# Patient Record
Sex: Female | Born: 1960 | Race: White | Hispanic: No | Marital: Married | Smoking: Current every day smoker
Health system: Southern US, Community
[De-identification: ages and names within clinical notes are randomized; demographics above are authoritative.]

## PROBLEM LIST (undated history)

## (undated) DIAGNOSIS — I509 Heart failure, unspecified: Secondary | ICD-10-CM

## (undated) DIAGNOSIS — J189 Pneumonia, unspecified organism: Secondary | ICD-10-CM

## (undated) DIAGNOSIS — J449 Chronic obstructive pulmonary disease, unspecified: Secondary | ICD-10-CM

## (undated) DIAGNOSIS — I209 Angina pectoris, unspecified: Secondary | ICD-10-CM

## (undated) DIAGNOSIS — K219 Gastro-esophageal reflux disease without esophagitis: Secondary | ICD-10-CM

## (undated) DIAGNOSIS — G4730 Sleep apnea, unspecified: Secondary | ICD-10-CM

## (undated) DIAGNOSIS — I219 Acute myocardial infarction, unspecified: Secondary | ICD-10-CM

## (undated) HISTORY — PX: TONSILLECTOMY: SUR1361

## (undated) HISTORY — PX: CORONARY ARTERY BYPASS GRAFT: SHX141

## (undated) HISTORY — PX: CORONARY ANGIOPLASTY: SHX604

---

## 2015-07-04 ENCOUNTER — Inpatient Hospital Stay (HOSPITAL_COMMUNITY): Payer: Medicaid Other

## 2015-07-04 ENCOUNTER — Inpatient Hospital Stay (HOSPITAL_COMMUNITY)
Admission: AD | Admit: 2015-07-04 | Discharge: 2015-07-20 | DRG: 296 | Disposition: E | Payer: Medicaid Other | Source: Other Acute Inpatient Hospital | Attending: Internal Medicine | Admitting: Internal Medicine

## 2015-07-04 ENCOUNTER — Encounter (HOSPITAL_COMMUNITY): Payer: Self-pay | Admitting: Surgery

## 2015-07-04 DIAGNOSIS — G253 Myoclonus: Secondary | ICD-10-CM | POA: Diagnosis present

## 2015-07-04 DIAGNOSIS — J930 Spontaneous tension pneumothorax: Secondary | ICD-10-CM | POA: Diagnosis present

## 2015-07-04 DIAGNOSIS — R739 Hyperglycemia, unspecified: Secondary | ICD-10-CM | POA: Diagnosis present

## 2015-07-04 DIAGNOSIS — E870 Hyperosmolality and hypernatremia: Secondary | ICD-10-CM | POA: Diagnosis present

## 2015-07-04 DIAGNOSIS — I2510 Atherosclerotic heart disease of native coronary artery without angina pectoris: Secondary | ICD-10-CM | POA: Diagnosis not present

## 2015-07-04 DIAGNOSIS — G9341 Metabolic encephalopathy: Secondary | ICD-10-CM | POA: Diagnosis present

## 2015-07-04 DIAGNOSIS — Z515 Encounter for palliative care: Secondary | ICD-10-CM | POA: Diagnosis not present

## 2015-07-04 DIAGNOSIS — R9401 Abnormal electroencephalogram [EEG]: Secondary | ICD-10-CM | POA: Insufficient documentation

## 2015-07-04 DIAGNOSIS — G935 Compression of brain: Secondary | ICD-10-CM | POA: Diagnosis present

## 2015-07-04 DIAGNOSIS — B963 Hemophilus influenzae [H. influenzae] as the cause of diseases classified elsewhere: Secondary | ICD-10-CM | POA: Diagnosis present

## 2015-07-04 DIAGNOSIS — R4020 Unspecified coma: Secondary | ICD-10-CM | POA: Diagnosis present

## 2015-07-04 DIAGNOSIS — E222 Syndrome of inappropriate secretion of antidiuretic hormone: Secondary | ICD-10-CM | POA: Diagnosis present

## 2015-07-04 DIAGNOSIS — R403 Persistent vegetative state: Secondary | ICD-10-CM | POA: Diagnosis present

## 2015-07-04 DIAGNOSIS — R140 Abdominal distension (gaseous): Secondary | ICD-10-CM

## 2015-07-04 DIAGNOSIS — G931 Anoxic brain damage, not elsewhere classified: Secondary | ICD-10-CM | POA: Diagnosis present

## 2015-07-04 DIAGNOSIS — G936 Cerebral edema: Secondary | ICD-10-CM | POA: Diagnosis present

## 2015-07-04 DIAGNOSIS — Z978 Presence of other specified devices: Secondary | ICD-10-CM

## 2015-07-04 DIAGNOSIS — J982 Interstitial emphysema: Secondary | ICD-10-CM | POA: Diagnosis present

## 2015-07-04 DIAGNOSIS — Z6834 Body mass index (BMI) 34.0-34.9, adult: Secondary | ICD-10-CM

## 2015-07-04 DIAGNOSIS — G40901 Epilepsy, unspecified, not intractable, with status epilepticus: Secondary | ICD-10-CM | POA: Diagnosis present

## 2015-07-04 DIAGNOSIS — I6782 Cerebral ischemia: Secondary | ICD-10-CM | POA: Diagnosis not present

## 2015-07-04 DIAGNOSIS — J9602 Acute respiratory failure with hypercapnia: Secondary | ICD-10-CM | POA: Diagnosis present

## 2015-07-04 DIAGNOSIS — G4730 Sleep apnea, unspecified: Secondary | ICD-10-CM | POA: Diagnosis present

## 2015-07-04 DIAGNOSIS — Z66 Do not resuscitate: Secondary | ICD-10-CM | POA: Diagnosis not present

## 2015-07-04 DIAGNOSIS — I252 Old myocardial infarction: Secondary | ICD-10-CM

## 2015-07-04 DIAGNOSIS — J441 Chronic obstructive pulmonary disease with (acute) exacerbation: Secondary | ICD-10-CM | POA: Diagnosis present

## 2015-07-04 DIAGNOSIS — E876 Hypokalemia: Secondary | ICD-10-CM | POA: Diagnosis not present

## 2015-07-04 DIAGNOSIS — Z951 Presence of aortocoronary bypass graft: Secondary | ICD-10-CM

## 2015-07-04 DIAGNOSIS — I469 Cardiac arrest, cause unspecified: Secondary | ICD-10-CM | POA: Diagnosis present

## 2015-07-04 DIAGNOSIS — G9340 Encephalopathy, unspecified: Secondary | ICD-10-CM | POA: Diagnosis not present

## 2015-07-04 DIAGNOSIS — R7881 Bacteremia: Secondary | ICD-10-CM | POA: Diagnosis present

## 2015-07-04 DIAGNOSIS — F17210 Nicotine dependence, cigarettes, uncomplicated: Secondary | ICD-10-CM | POA: Diagnosis present

## 2015-07-04 DIAGNOSIS — Z452 Encounter for adjustment and management of vascular access device: Secondary | ICD-10-CM

## 2015-07-04 DIAGNOSIS — J9600 Acute respiratory failure, unspecified whether with hypoxia or hypercapnia: Secondary | ICD-10-CM

## 2015-07-04 HISTORY — DX: Sleep apnea, unspecified: G47.30

## 2015-07-04 HISTORY — DX: Heart failure, unspecified: I50.9

## 2015-07-04 HISTORY — DX: Chronic obstructive pulmonary disease, unspecified: J44.9

## 2015-07-04 HISTORY — DX: Gastro-esophageal reflux disease without esophagitis: K21.9

## 2015-07-04 HISTORY — DX: Angina pectoris, unspecified: I20.9

## 2015-07-04 HISTORY — DX: Acute myocardial infarction, unspecified: I21.9

## 2015-07-04 HISTORY — DX: Pneumonia, unspecified organism: J18.9

## 2015-07-04 LAB — CBC WITH DIFFERENTIAL/PLATELET
BASOS ABS: 0 10*3/uL (ref 0.0–0.1)
BASOS PCT: 0 %
EOS ABS: 0 10*3/uL (ref 0.0–0.7)
Eosinophils Relative: 0 %
HEMATOCRIT: 39.4 % (ref 36.0–46.0)
HEMOGLOBIN: 12.5 g/dL (ref 12.0–15.0)
Lymphocytes Relative: 3 %
Lymphs Abs: 0.5 10*3/uL — ABNORMAL LOW (ref 0.7–4.0)
MCH: 29.6 pg (ref 26.0–34.0)
MCHC: 31.7 g/dL (ref 30.0–36.0)
MCV: 93.4 fL (ref 78.0–100.0)
Monocytes Absolute: 1.1 10*3/uL — ABNORMAL HIGH (ref 0.1–1.0)
Monocytes Relative: 6 %
NEUTROS ABS: 15.6 10*3/uL — AB (ref 1.7–7.7)
NEUTROS PCT: 91 %
Platelets: 207 10*3/uL (ref 150–400)
RBC: 4.22 MIL/uL (ref 3.87–5.11)
RDW: 14 % (ref 11.5–15.5)
WBC: 17.2 10*3/uL — AB (ref 4.0–10.5)

## 2015-07-04 LAB — PROTIME-INR
INR: 1.15 (ref 0.00–1.49)
Prothrombin Time: 14.9 seconds (ref 11.6–15.2)

## 2015-07-04 LAB — BLOOD GAS, ARTERIAL
Acid-Base Excess: 0.5 mmol/L (ref 0.0–2.0)
BICARBONATE: 26.7 meq/L — AB (ref 20.0–24.0)
DRAWN BY: 437071
FIO2: 1
MECHVT: 450 mL
O2 SAT: 99.6 %
PATIENT TEMPERATURE: 98.6
PEEP: 5 cmH2O
PO2 ART: 379 mmHg — AB (ref 80.0–100.0)
RATE: 20 resp/min
TCO2: 28.6 mmol/L (ref 0–100)
pCO2 arterial: 60.8 mmHg (ref 35.0–45.0)
pH, Arterial: 7.265 — ABNORMAL LOW (ref 7.350–7.450)

## 2015-07-04 LAB — LACTIC ACID, PLASMA: LACTIC ACID, VENOUS: 1.5 mmol/L (ref 0.5–2.0)

## 2015-07-04 LAB — TROPONIN I
TROPONIN I: 0.07 ng/mL — AB (ref <0.031)
TROPONIN I: 0.04 ng/mL — AB (ref <0.031)
Troponin I: 0.04 ng/mL — ABNORMAL HIGH (ref <0.031)

## 2015-07-04 LAB — COMPREHENSIVE METABOLIC PANEL
ALBUMIN: 2.6 g/dL — AB (ref 3.5–5.0)
ALK PHOS: 79 U/L (ref 38–126)
ALT: 53 U/L (ref 14–54)
ANION GAP: 14 (ref 5–15)
AST: 76 U/L — AB (ref 15–41)
BUN: 12 mg/dL (ref 6–20)
CALCIUM: 7.8 mg/dL — AB (ref 8.9–10.3)
CO2: 23 mmol/L (ref 22–32)
Chloride: 95 mmol/L — ABNORMAL LOW (ref 101–111)
Creatinine, Ser: 1.09 mg/dL — ABNORMAL HIGH (ref 0.44–1.00)
GFR calc Af Amer: >60 mL/min (ref >60)
GFR calc non Af Amer: 57 mL/min — ABNORMAL LOW (ref >60)
GLUCOSE: 149 mg/dL — AB (ref 65–99)
Potassium: 4 mmol/L (ref 3.5–5.1)
SODIUM: 132 mmol/L — AB (ref 135–145)
Total Bilirubin: 0.5 mg/dL (ref 0.3–1.2)
Total Protein: 5.4 g/dL — ABNORMAL LOW (ref 6.5–8.1)

## 2015-07-04 LAB — POCT I-STAT 3, ART BLOOD GAS (G3+)
BICARBONATE: 26 meq/L — AB (ref 20.0–24.0)
O2 Saturation: 89 %
Patient temperature: 36.9
TCO2: 27 mmol/L (ref 0–100)
pCO2 arterial: 46.6 mmHg — ABNORMAL HIGH (ref 35.0–45.0)
pH, Arterial: 7.355 (ref 7.350–7.450)
pO2, Arterial: 60 mmHg — ABNORMAL LOW (ref 80.0–100.0)

## 2015-07-04 LAB — PHOSPHORUS: PHOSPHORUS: 4.7 mg/dL — AB (ref 2.5–4.6)

## 2015-07-04 LAB — ECHOCARDIOGRAM COMPLETE: WEIGHTICAEL: 3382.74 [oz_av]

## 2015-07-04 LAB — GLUCOSE, CAPILLARY
GLUCOSE-CAPILLARY: 108 mg/dL — AB (ref 65–99)
GLUCOSE-CAPILLARY: 116 mg/dL — AB (ref 65–99)
GLUCOSE-CAPILLARY: 207 mg/dL — AB (ref 65–99)
Glucose-Capillary: 136 mg/dL — ABNORMAL HIGH (ref 65–99)
Glucose-Capillary: 163 mg/dL — ABNORMAL HIGH (ref 65–99)
Glucose-Capillary: 140 mg/dL — ABNORMAL HIGH (ref 65–99)

## 2015-07-04 LAB — RAPID URINE DRUG SCREEN, HOSP PERFORMED
AMPHETAMINES: NOT DETECTED
BENZODIAZEPINES: POSITIVE — AB
Barbiturates: NOT DETECTED
Cocaine: NOT DETECTED
Opiates: POSITIVE — AB
Tetrahydrocannabinol: NOT DETECTED

## 2015-07-04 LAB — ETHANOL

## 2015-07-04 LAB — TRIGLYCERIDES: TRIGLYCERIDES: 93 mg/dL (ref <150)

## 2015-07-04 LAB — MRSA PCR SCREENING: MRSA by PCR: POSITIVE — AB

## 2015-07-04 LAB — PROCALCITONIN: Procalcitonin: 2.72 ng/mL

## 2015-07-04 LAB — MAGNESIUM: Magnesium: 1.7 mg/dL (ref 1.7–2.4)

## 2015-07-04 MED ORDER — MUPIROCIN 2 % EX OINT
1.0000 "application " | TOPICAL_OINTMENT | Freq: Two times a day (BID) | CUTANEOUS | Status: DC
  Administered 2015-07-04 – 2015-07-07 (×7): 1 via NASAL
  Filled 2015-07-04: qty 22

## 2015-07-04 MED ORDER — FENTANYL CITRATE (PF) 100 MCG/2ML IJ SOLN
100.0000 ug | INTRAMUSCULAR | Status: DC | PRN
  Administered 2015-07-04 – 2015-07-05 (×2): 100 ug via INTRAVENOUS
  Filled 2015-07-04 (×2): qty 2

## 2015-07-04 MED ORDER — HEPARIN SODIUM (PORCINE) 5000 UNIT/ML IJ SOLN
5000.0000 [IU] | Freq: Three times a day (TID) | INTRAMUSCULAR | Status: DC
  Administered 2015-07-04 – 2015-07-07 (×11): 5000 [IU] via SUBCUTANEOUS
  Filled 2015-07-04 (×11): qty 1

## 2015-07-04 MED ORDER — FENTANYL CITRATE (PF) 100 MCG/2ML IJ SOLN: 100.0000 ug | INTRAMUSCULAR | Status: DC | PRN

## 2015-07-04 MED ORDER — DEXTROSE 5 % IV SOLN
1.0000 g | INTRAVENOUS | Status: DC
  Administered 2015-07-04 – 2015-07-05 (×2): 1 g via INTRAVENOUS
  Filled 2015-07-04 (×3): qty 10

## 2015-07-04 MED ORDER — CHLORHEXIDINE GLUCONATE 0.12% ORAL RINSE (MEDLINE KIT)
15.0000 mL | Freq: Two times a day (BID) | OROMUCOSAL | Status: DC
Start: 2015-07-04 — End: 2015-07-07
  Administered 2015-07-04 – 2015-07-07 (×7): 15 mL via OROMUCOSAL

## 2015-07-04 MED ORDER — PANTOPRAZOLE SODIUM 40 MG IV SOLR
40.0000 mg | Freq: Every day | INTRAVENOUS | Status: DC
  Administered 2015-07-04 – 2015-07-06 (×3): 40 mg via INTRAVENOUS
  Filled 2015-07-04 (×3): qty 40

## 2015-07-04 MED ORDER — SODIUM CHLORIDE 0.9 % IV SOLN
1500.0000 mg | Freq: Once | INTRAVENOUS | Status: DC
  Filled 2015-07-04: qty 30

## 2015-07-04 MED ORDER — PROPOFOL 1000 MG/100ML IV EMUL
0.0000 ug/kg/min | INTRAVENOUS | Status: DC
  Administered 2015-07-04: 25 ug/kg/min via INTRAVENOUS
  Administered 2015-07-04: 35 ug/kg/min via INTRAVENOUS
  Administered 2015-07-04: 50 ug/kg/min via INTRAVENOUS
  Administered 2015-07-04: 35 ug/kg/min via INTRAVENOUS
  Administered 2015-07-04 – 2015-07-07 (×18): 50 ug/kg/min via INTRAVENOUS
  Filled 2015-07-04 (×7): qty 100
  Filled 2015-07-04: qty 200
  Filled 2015-07-04 (×7): qty 100
  Filled 2015-07-04: qty 200
  Filled 2015-07-04 (×5): qty 100
  Filled 2015-07-04: qty 200
  Filled 2015-07-04: qty 100

## 2015-07-04 MED ORDER — PHENYTOIN SODIUM 50 MG/ML IJ SOLN
100.0000 mg | Freq: Three times a day (TID) | INTRAMUSCULAR | Status: DC
  Administered 2015-07-04 – 2015-07-07 (×10): 100 mg via INTRAVENOUS
  Filled 2015-07-04 (×11): qty 2

## 2015-07-04 MED ORDER — CHLORHEXIDINE GLUCONATE CLOTH 2 % EX PADS
6.0000 | MEDICATED_PAD | Freq: Every day | CUTANEOUS | Status: DC
  Administered 2015-07-05 – 2015-07-07 (×3): 6 via TOPICAL

## 2015-07-04 MED ORDER — SODIUM CHLORIDE 0.9 % IV SOLN
250.0000 mL | INTRAVENOUS | Status: DC | PRN
  Administered 2015-07-06: 250 mL via INTRAVENOUS

## 2015-07-04 MED ORDER — INSULIN ASPART 100 UNIT/ML ~~LOC~~ SOLN
2.0000 [IU] | SUBCUTANEOUS | Status: DC
Start: 2015-07-04 — End: 2015-07-07
  Administered 2015-07-04 (×2): 2 [IU] via SUBCUTANEOUS
  Administered 2015-07-04: 4 [IU] via SUBCUTANEOUS
  Administered 2015-07-04: 6 [IU] via SUBCUTANEOUS
  Administered 2015-07-05: 2 [IU] via SUBCUTANEOUS
  Administered 2015-07-05: 4 [IU] via SUBCUTANEOUS
  Administered 2015-07-05 (×2): 2 [IU] via SUBCUTANEOUS
  Administered 2015-07-06: 4 [IU] via SUBCUTANEOUS
  Administered 2015-07-06: 2 [IU] via SUBCUTANEOUS
  Administered 2015-07-07: 4 [IU] via SUBCUTANEOUS
  Administered 2015-07-07: 2 [IU] via SUBCUTANEOUS

## 2015-07-04 MED ORDER — METHYLPREDNISOLONE SODIUM SUCC 40 MG IJ SOLR
40.0000 mg | Freq: Two times a day (BID) | INTRAMUSCULAR | Status: DC
  Administered 2015-07-04 – 2015-07-07 (×7): 40 mg via INTRAVENOUS
  Filled 2015-07-04 (×7): qty 1

## 2015-07-04 MED ORDER — ALBUTEROL SULFATE (2.5 MG/3ML) 0.083% IN NEBU: 2.5000 mg | INHALATION_SOLUTION | RESPIRATORY_TRACT | Status: DC | PRN

## 2015-07-04 MED ORDER — GADOBENATE DIMEGLUMINE 529 MG/ML IV SOLN
20.0000 mL | Freq: Once | INTRAVENOUS | Status: AC | PRN
  Administered 2015-07-04: 20 mL via INTRAVENOUS

## 2015-07-04 MED ORDER — SODIUM CHLORIDE 0.9 % IV SOLN
INTRAVENOUS | Status: DC
  Administered 2015-07-04 – 2015-07-05 (×3): via INTRAVENOUS

## 2015-07-04 MED ORDER — DEXTROSE 5 % IV SOLN
500.0000 mg | INTRAVENOUS | Status: DC
  Administered 2015-07-04 – 2015-07-06 (×3): 500 mg via INTRAVENOUS
  Filled 2015-07-04 (×4): qty 500

## 2015-07-04 MED ORDER — SODIUM CHLORIDE 0.9 % IV BOLUS (SEPSIS)
1000.0000 mL | Freq: Once | INTRAVENOUS | Status: AC
  Administered 2015-07-04: 1000 mL via INTRAVENOUS

## 2015-07-04 MED ORDER — SODIUM CHLORIDE 0.9 % IV SOLN: 1500.0000 mg | Freq: Once | INTRAVENOUS | Status: DC

## 2015-07-04 MED ORDER — ANTISEPTIC ORAL RINSE SOLUTION (CORINZ)
7.0000 mL | OROMUCOSAL | Status: DC
  Administered 2015-07-04 – 2015-07-07 (×36): 7 mL via OROMUCOSAL

## 2015-07-04 MED ORDER — SODIUM CHLORIDE 0.9 % IV SOLN
1500.0000 mg | Freq: Once | INTRAVENOUS | Status: AC
  Administered 2015-07-04: 1500 mg via INTRAVENOUS
  Filled 2015-07-04: qty 30

## 2015-07-04 MED ORDER — LEVETIRACETAM 500 MG/5ML IV SOLN
2000.0000 mg | Freq: Two times a day (BID) | INTRAVENOUS | Status: DC
  Administered 2015-07-04 – 2015-07-07 (×7): 2000 mg via INTRAVENOUS
  Filled 2015-07-04 (×8): qty 20

## 2015-07-04 MED ORDER — IPRATROPIUM-ALBUTEROL 0.5-2.5 (3) MG/3ML IN SOLN
3.0000 mL | Freq: Four times a day (QID) | RESPIRATORY_TRACT | Status: DC
  Administered 2015-07-04 – 2015-07-07 (×15): 3 mL via RESPIRATORY_TRACT
  Filled 2015-07-04 (×15): qty 3

## 2015-07-04 MED FILL — Fentanyl Citrate Preservative Free (PF) Inj 100 MCG/2ML: INTRAMUSCULAR | Qty: 4 | Status: AC

## 2015-07-04 NOTE — Progress Notes (Signed)
  Echocardiogram 2D Echocardiogram has been performed.  Arvil ChacoFoster, Sherrel Ploch 07/12/2015, 2:29 PM

## 2015-07-04 NOTE — Progress Notes (Signed)
EEG Completed; Results Pending  

## 2015-07-04 NOTE — Progress Notes (Signed)
Pt transported to MRI and back to 2H12 on vent without incident. Vitals remained stable throughout. Pt was suctioned at MRI due to increased secretions.

## 2015-07-04 NOTE — Procedures (Signed)
Central Venous Catheter Insertion Procedure Note Gabriella Baker Dec 27, 1960  Procedure: Insertion of Central Venous Catheter Indications: Assessment of intravascular volume, Drug and/or fluid administration and Frequent blood sampling  Procedure Details Consent: Risks of procedure as well as the alternatives and risks of each were explained to the (patient/caregiver).  Consent for procedure obtained.   Time Out: Verified patient identification, verified procedure, site/side was marked, verified correct patient position, special equipment/implants available, medications/allergies/relevent history reviewed, required imaging and test results available.  Performed  Maximum sterile technique was used including antiseptics, cap, gloves, gown, hand hygiene, mask and sheet. Skin prep: Chlorhexidine; local anesthetic administered A antimicrobial bonded/coated triple lumen catheter was placed in the left subclavian vein to 18 cm using the Seldinger technique.  Evaluation Blood flow good Complications: No apparent complications Patient did tolerate procedure well. Chest X-ray ordered to verify placement.  CXR: pending.    Procedure performed with ultrasound guidance for real time vessel cannulation.      Gabriella BrimBrandi Seymone Forlenza, NP-C [facility] Pgr: [phone] or if no answer [phone] 06/20/2015, 4:40 PM

## 2015-07-04 NOTE — Procedures (Signed)
HPI:  55 y/o with CP arrest and myoclonus  TECHNICAL SUMMARY:  A multichannel referential and bipolar montage EEG using the standard international 10-20 system was performed on the patient described as sedated and on ventilator.  There is no occipital dominant rhythm.  The background consists primarily of a burst/suppression type of rhythm with evidence of generalized sharp waves, some of which are triphasic in nature.  ACTIVATION:  Stepwise photic stimulation and hyperventilation are not performed.  No noxious stimuli was administered to determine reactivity of the background.  ABNORMAL ACTIVITY:  As above, there are frequent periodic generalized sharp waves discharges, some of which have a triphasic appearance.  SLEEP:  No sleep  IMPRESSION: This EEG is markedly abnormal due to burst-suppression pattern with intermittent bilateral periodic sharp waves, some of which have a triphasic appearance.   Clinical Correlation: This record shows evidence of severe diffuse or bilateral cerebral dysfunction which more likely reflects the patient's history of anoxic brain injury as opposed to ictal epileptiform activity. In the absence of CNS active, sedating, or anesthetic medications, this suggests a poor prognosis. If further clinical questions remain, repeat EEG off sedation may be helpful. Clinical correlation is advised .

## 2015-07-04 NOTE — H&P (Signed)
PULMONARY / CRITICAL CARE MEDICINE   Name: Gabriella Baker MRN: [id number] DOB: 08/28/1960    ADMISSION DATE:  Jul 28, 2015 CONSULTATION DATE:  07/28/15  REFERRING MD:  [facility]  CHIEF COMPLAINT:  Cardiac arrest  HISTORY OF PRESENT ILLNESS:  Patient is encephalopathic and/or intubated. Therefore history has been obtained from chart review.  55 year old female with PMH COPD and CAD. Has been feeling "unwell" for about a week. Thought she had pneumonia with SOB. 5/15 she suffered a witnessed cardiac arrest. Bystander called EMS, but did not initiate CPR. EMS arrived about 10 to 15 mins later. They noted her to be asystolic on the cardiac monitor. They started ACLS while en route to the hospital which required about 25 mins ACLS until ROSC. King airway was placed and she was difficult to bag. Upon arrival to ED king airway was replaced, which was a difficult transition. She was then found to have subcutaneous air and tension pneumothorax. Reportedly needle decompression was attempted, but location was incorrect and subclavian artery was punctured. Chest tube was then placed to L chest. Patient was transferred to [facility] for ICU admission. Myoclonus noted en route.    PAST MEDICAL HISTORY :  She  has no past medical history on file.  PAST SURGICAL HISTORY: She  has no past surgical history on file.  Allergies not on file  No current facility-administered medications on file prior to encounter.   No current outpatient prescriptions on file prior to encounter.    FAMILY HISTORY:  Her has no family status information on file.   SOCIAL HISTORY: She    REVIEW OF SYSTEMS:   Unable  SUBJECTIVE:   VITAL SIGNS: BP 119/89 mmHg  Pulse 109  Temp(Src) 98.5 F (36.9 C) (Oral)  Resp 19  SpO2 100%  HEMODYNAMICS:    VENTILATOR SETTINGS: Vent Mode:  [-] PRVC FiO2 (%):  [100 %] 100 % Set Rate:  [16 bmp] 16 bmp Vt Set:  [450 mL] 450 mL PEEP:  [5 cmH20] 5 cmH20 Plateau Pressure:  [26  cmH20] 26 cmH20  INTAKE / OUTPUT:    PHYSICAL EXAMINATION: General:  Morbidly obese female in NAD Neuro:  Comatose on vent, pupils unreactive. Myoclonus like jerking HEENT:  [place]/AT, no JVD Cardiovascular:  RRR, no mrg Lungs:  Coarse, subq emphysema Abdomen:  Markedly distended Musculoskeletal:  No acute deformity Skin:  Grossly intact  LABS:  BMET No results for input(s): NA, K, CL, CO2, BUN, CREATININE, GLUCOSE in the last 168 hours.  Electrolytes No results for input(s): CALCIUM, MG, PHOS in the last 168 hours.  CBC No results for input(s): WBC, HGB, HCT, PLT in the last 168 hours.  Coag's No results for input(s): APTT, INR in the last 168 hours.  Sepsis Markers No results for input(s): LATICACIDVEN, PROCALCITON, O2SATVEN in the last 168 hours.  ABG No results for input(s): PHART, PCO2ART, PO2ART in the last 168 hours.  Liver Enzymes No results for input(s): AST, ALT, ALKPHOS, BILITOT, ALBUMIN in the last 168 hours.  Cardiac Enzymes No results for input(s): TROPONINI, PROBNP in the last 168 hours.  Glucose No results for input(s): GLUCAP in the last 168 hours.  Imaging No results found.   STUDIES:  CTA chest 5/15 > no PE, large L PTX with associated pneumomediastinum and pneumoperitoneum. Subq emphysema  CT head 5/15 > cerebral edema  CULTURES: [facility] 5/16 >  Urine 5/16 >   ANTIBIOTICS: none  SIGNIFICANT EVENTS: 5/15 cardiac arrest  LINES/TUBES: ETT 5/15 >  DISCUSSION: [age] F s/p asystolic cardiac arrest. Downtime estimated just over 30 mins  ASSESSMENT / PLAN:  PULMONARY A: Inability to protect airway secondary to cardiac arrest Acute hypercarbic respiratory failure COPD with suspected exacerbation L PTX Pneumomediatstinum  P:   Full vent support CXR ABG Vent bundle Nebs IV steroids CT to suction 20 cmH20  CARDIOVASCULAR A:  Cardiac arrest asystole H/o CAD Pneumomediastinum  P:  Telemetry monitoring MAP goal >  65mmHg Currently maintaining BP on own Trend troponin Echo Lactic acid  RENAL A:   Hyponatremia  P:   Repeat CMP Bolus 1L NS  GASTROINTESTINAL A:   Abdominal distension, likely due to bagging king airway. Pneumoperitoneum likely secondary to large PTX  P:   KUB now NPO tonight Protonix  HEMATOLOGIC A:   ? PE (d-dimer at [place] > 8000), d/w radiolgy, no PE demonstrated.  P:  Verify CT angio chest results Possibly heparin gtt pending CT Coags  INFECTIOUS A:   No acute issues  P:   Monitor WBC and fever curve  ENDOCRINE A:   Hyperglycemia with questionable history of DM  P:   CBG monitoring and SSI  NEUROLOGIC A:   Acute metabolic/anoxic encephalopathy  P:   RASS goal: 0 Propfol needed for vent synchrony  [facility]   FAMILY  - Updates: son 5/16 early AM via phone. She is legally married to wife dawn, but they are separated. Will need to further explore who is the decision maker. Full code.   - Inter-disciplinary family meet or Palliative Care meeting due by:  5/23   Joneen RoachPaul Mickell Birdwell, AGACNP-BC [place] [facility] Pager [phone] or [phone]) [phone]  Jun 25, 2015 4:19 AM

## 2015-07-04 NOTE — Progress Notes (Signed)
Attempted to call both numbers available for Gabriella Baker [phone], [phone]. No answer left message at home phone to return call. No number for son available. Awaiting return call for consent for central line.

## 2015-07-05 ENCOUNTER — Inpatient Hospital Stay (HOSPITAL_COMMUNITY): Payer: Medicaid Other

## 2015-07-05 ENCOUNTER — Encounter (HOSPITAL_COMMUNITY): Payer: Self-pay | Admitting: *Deleted

## 2015-07-05 DIAGNOSIS — I6782 Cerebral ischemia: Secondary | ICD-10-CM

## 2015-07-05 LAB — BLOOD CULTURE ID PANEL (REFLEXED)
ACINETOBACTER BAUMANNII: NOT DETECTED
CANDIDA ALBICANS: NOT DETECTED
CANDIDA GLABRATA: NOT DETECTED
Candida krusei: NOT DETECTED
Candida parapsilosis: NOT DETECTED
Candida tropicalis: NOT DETECTED
Carbapenem resistance: NOT DETECTED
ENTEROBACTER CLOACAE COMPLEX: NOT DETECTED
ESCHERICHIA COLI: NOT DETECTED
Enterobacteriaceae species: NOT DETECTED
Enterococcus species: NOT DETECTED
HAEMOPHILUS INFLUENZAE: DETECTED — AB
Klebsiella oxytoca: NOT DETECTED
Klebsiella pneumoniae: NOT DETECTED
Listeria monocytogenes: NOT DETECTED
METHICILLIN RESISTANCE: NOT DETECTED
NEISSERIA MENINGITIDIS: NOT DETECTED
PROTEUS SPECIES: NOT DETECTED
Pseudomonas aeruginosa: NOT DETECTED
STREPTOCOCCUS PNEUMONIAE: NOT DETECTED
STREPTOCOCCUS PYOGENES: NOT DETECTED
STREPTOCOCCUS SPECIES: NOT DETECTED
Serratia marcescens: NOT DETECTED
Staphylococcus aureus (BCID): NOT DETECTED
Staphylococcus species: NOT DETECTED
Streptococcus agalactiae: NOT DETECTED
Vancomycin resistance: NOT DETECTED

## 2015-07-05 LAB — BLOOD GAS, ARTERIAL
ACID-BASE EXCESS: 1.1 mmol/L (ref 0.0–2.0)
Bicarbonate: 26.2 mEq/L — ABNORMAL HIGH (ref 20.0–24.0)
Drawn by: 448981
FIO2: 0.6
MECHVT: 450 mL
O2 SAT: 97.1 %
PCO2 ART: 49 mmHg — AB (ref 35.0–45.0)
PEEP: 5 cmH2O
PO2 ART: 97.6 mmHg (ref 80.0–100.0)
Patient temperature: 98.6
RATE: 28 resp/min
TCO2: 27.7 mmol/L (ref 0–100)
pH, Arterial: 7.347 — ABNORMAL LOW (ref 7.350–7.450)

## 2015-07-05 LAB — GLUCOSE, CAPILLARY
GLUCOSE-CAPILLARY: 133 mg/dL — AB (ref 65–99)
GLUCOSE-CAPILLARY: 117 mg/dL — AB (ref 65–99)
Glucose-Capillary: 129 mg/dL — ABNORMAL HIGH (ref 65–99)
Glucose-Capillary: 138 mg/dL — ABNORMAL HIGH (ref 65–99)
Glucose-Capillary: 157 mg/dL — ABNORMAL HIGH (ref 65–99)
Glucose-Capillary: 98 mg/dL (ref 65–99)

## 2015-07-05 LAB — BASIC METABOLIC PANEL
Anion gap: 10 (ref 5–15)
BUN: 8 mg/dL (ref 6–20)
CO2: 27 mmol/L (ref 22–32)
Calcium: 8.3 mg/dL — ABNORMAL LOW (ref 8.9–10.3)
Chloride: 105 mmol/L (ref 101–111)
Creatinine, Ser: 0.68 mg/dL (ref 0.44–1.00)
GFR calc Af Amer: >60 mL/min (ref >60)
GLUCOSE: 206 mg/dL — AB (ref 65–99)
POTASSIUM: 2.9 mmol/L — AB (ref 3.5–5.1)
Sodium: 142 mmol/L (ref 135–145)

## 2015-07-05 LAB — URINE CULTURE: Culture: NO GROWTH

## 2015-07-05 LAB — SODIUM
SODIUM: 145 mmol/L (ref 135–145)
Sodium: 140 mmol/L (ref 135–145)

## 2015-07-05 LAB — PROCALCITONIN: Procalcitonin: 2.37 ng/mL

## 2015-07-05 MED ORDER — MIDAZOLAM HCL 2 MG/2ML IJ SOLN: 2.0000 mg | INTRAMUSCULAR | Status: DC | PRN

## 2015-07-05 MED ORDER — SODIUM CHLORIDE 0.9 % IV BOLUS (SEPSIS)
500.0000 mL | Freq: Once | INTRAVENOUS | Status: AC
  Administered 2015-07-05: 500 mL via INTRAVENOUS

## 2015-07-05 MED ORDER — FENTANYL CITRATE (PF) 100 MCG/2ML IJ SOLN
50.0000 ug | Freq: Once | INTRAMUSCULAR | Status: AC
  Administered 2015-07-05: 50 ug via INTRAVENOUS

## 2015-07-05 MED ORDER — POTASSIUM CHLORIDE 10 MEQ/50ML IV SOLN
10.0000 meq | INTRAVENOUS | Status: AC
  Administered 2015-07-05 (×4): 10 meq via INTRAVENOUS
  Filled 2015-07-05 (×4): qty 50

## 2015-07-05 MED ORDER — SODIUM CHLORIDE 3 % IV SOLN: INTRAVENOUS | Status: DC

## 2015-07-05 MED ORDER — SODIUM CHLORIDE 0.9 % IV SOLN
25.0000 ug/h | INTRAVENOUS | Status: DC
  Administered 2015-07-05: 50 ug/h via INTRAVENOUS
  Administered 2015-07-07: 150 ug/h via INTRAVENOUS
  Filled 2015-07-05 (×3): qty 50

## 2015-07-05 MED ORDER — SODIUM CHLORIDE 0.9% FLUSH
10.0000 mL | Freq: Two times a day (BID) | INTRAVENOUS | Status: DC
  Administered 2015-07-05 – 2015-07-07 (×5): 10 mL

## 2015-07-05 MED ORDER — SODIUM CHLORIDE 0.9% FLUSH: 10.0000 mL | INTRAVENOUS | Status: DC | PRN

## 2015-07-05 MED ORDER — FENTANYL BOLUS VIA INFUSION
50.0000 ug | INTRAVENOUS | Status: DC | PRN
  Administered 2015-07-05 (×3): 50 ug via INTRAVENOUS
  Filled 2015-07-05: qty 50

## 2015-07-05 MED ORDER — SODIUM CHLORIDE 3 % IV SOLN
INTRAVENOUS | Status: DC
  Administered 2015-07-05: 50 mL/h via INTRAVENOUS
  Administered 2015-07-06 (×2): 75 mL/h via INTRAVENOUS
  Filled 2015-07-05 (×9): qty 500

## 2015-07-05 NOTE — Progress Notes (Signed)
Interval History:                                                                                                                      Gabriella Baker is an 55 y.o. female patient admitted to the ICU, post cardiac arrest, unknown downtime.   Her EEG yesterday showed evidence of severe anoxic encephalopathy.  No family at bedside.  Past Medical History: Past Medical History  Diagnosis Date  . COPD (chronic obstructive pulmonary disease) ([place])   . Pneumonia   . Myocardial infarction ([place])   . GERD (gastroesophageal reflux disease)   . Sleep apnea   . Anginal pain ([place])   . CHF (congestive heart failure) [facility])     Past Surgical History  Procedure Laterality Date  . Coronary angioplasty    . Tonsillectomy    . Coronary artery bypass graft      Family History: No family history on file.  Social History:   reports that she has been smoking Cigarettes.  She has been smoking about 1.00 pack per day. She does not have any smokeless tobacco history on file. She reports that she drinks about 1.2 oz of alcohol per week. She reports that she does not use illicit drugs.  Allergies:  Allergies  Allergen Reactions  . Levaquin [Levofloxacin]      Medications:                                                                                                                         Current facility-administered medications:  .  0.9 %  sodium chloride infusion, 250 mL, Intravenous, PRN, Corey Harold, NP .  0.9 %  sodium chloride infusion, , Intravenous, Continuous, Corey Harold, NP, Stopped at 07/05/15 1141 .  albuterol (PROVENTIL) (2.5 MG/3ML) 0.083% nebulizer solution 2.5 mg, 2.5 mg, Nebulization, Q2H PRN, Corey Harold, NP .  antiseptic oral rinse solution (CORINZ), 7 mL, Mouth Rinse, 10 times per day, Raylene Miyamoto, MD, 7 mL at 07/05/15 1200 .  azithromycin (ZITHROMAX) 500 mg in dextrose 5 % 250 mL IVPB, 500 mg, Intravenous, Q24H, Praveen Mannam, MD, 500 mg at 07/06/2015 1519 .   cefTRIAXone (ROCEPHIN) 1 g in dextrose 5 % 50 mL IVPB, 1 g, Intravenous, Q24H, Otilio Miu, RPH, 1 g at 07/09/2015 1519 .  chlorhexidine gluconate (SAGE KIT) (PERIDEX) 0.12 % solution 15 mL, 15 mL, Mouth Rinse, BID, Raylene Miyamoto, MD, 15 mL at 07/05/15 0803 .  Chlorhexidine  Gluconate Cloth 2 % PADS 6 each, 6 each, Topical, Q0600, Raylene Miyamoto, MD, 6 each at 07/05/15 0600 .  fentaNYL (SUBLIMAZE) 2,500 mcg in sodium chloride 0.9 % 250 mL (10 mcg/mL) infusion, 25-400 mcg/hr, Intravenous, Continuous, Praveen Mannam, MD, Last Rate: 10 mL/hr at 07/05/15 1245, 100 mcg/hr at 07/05/15 1245 .  fentaNYL (SUBLIMAZE) bolus via infusion 50 mcg, 50 mcg, Intravenous, Q1H PRN, Praveen Mannam, MD, 50 mcg at 07/05/15 1245 .  fentaNYL (SUBLIMAZE) injection 100 mcg, 100 mcg, Intravenous, Q15 min PRN, Corey Harold, NP, 100 mcg at 07/05/15 0808 .  fentaNYL (SUBLIMAZE) injection 100 mcg, 100 mcg, Intravenous, Q2H PRN, Corey Harold, NP .  heparin injection 5,000 Units, 5,000 Units, Subcutaneous, Q8H, Corey Harold, NP, 5,000 Units at 07/05/15 0500 .  insulin aspart (novoLOG) injection 2-6 Units, 2-6 Units, Subcutaneous, Q4H, Corey Harold, NP, 2 Units at 07/05/15 1305 .  ipratropium-albuterol (DUONEB) 0.5-2.5 (3) MG/3ML nebulizer solution 3 mL, 3 mL, Nebulization, Q6H, Corey Harold, NP, 3 mL at 07/05/15 0734 .  levETIRAcetam (KEPPRA) 2,000 mg in sodium chloride 0.9 % 100 mL IVPB, 2,000 mg, Intravenous, Q12H, Sabree Nuon Fuller Mandril, MD, 2,000 mg at 07/05/15 0438 .  methylPREDNISolone sodium succinate (SOLU-MEDROL) 40 mg/mL injection 40 mg, 40 mg, Intravenous, BID, Corey Harold, NP, 40 mg at 07/05/15 1115 .  midazolam (VERSED) injection 2 mg, 2 mg, Intravenous, Q15 min PRN, Praveen Mannam, MD .  midazolam (VERSED) injection 2 mg, 2 mg, Intravenous, Q2H PRN, Praveen Mannam, MD .  mupirocin ointment (BACTROBAN) 2 % 1 application, 1 application, Nasal, BID, Raylene Miyamoto, MD, 1 application at  88/91/69 1116 .  pantoprazole (PROTONIX) injection 40 mg, 40 mg, Intravenous, QHS, Corey Harold, NP, 40 mg at 07/18/2015 2152 .  phenytoin (DILANTIN) injection 100 mg, 100 mg, Intravenous, Q8H, Tayler Lassen Fuller Mandril, MD, 100 mg at 07/05/15 0500 .  propofol (DIPRIVAN) 1000 MG/100ML infusion, 0-50 mcg/kg/min, Intravenous, Continuous, Corey Harold, NP, Last Rate: 28.8 mL/hr at 07/05/15 1245, 50 mcg/kg/min at 07/05/15 1245 .  sodium chloride (hypertonic) 3 % solution, , Intravenous, Continuous, Mechell Girgis Fuller Mandril, MD, Last Rate: 50 mL/hr at 07/05/15 1141, 50 mL/hr at 07/05/15 1141 .  sodium chloride flush (NS) 0.9 % injection 10-40 mL, 10-40 mL, Intracatheter, Q12H, Raylene Miyamoto, MD, 10 mL at 07/05/15 1118 .  sodium chloride flush (NS) 0.9 % injection 10-40 mL, 10-40 mL, Intracatheter, PRN, Raylene Miyamoto, MD   Neurologic Examination:                                                                                                     Today's Vitals   07/05/15 1242 07/05/15 1246 07/05/15 1247 07/05/15 1252  BP: 164/100  173/106 139/81  Pulse: 128 130 128 122  Temp: 99.3 F (37.4 C) 99.1 F (37.3 C) 99.1 F (37.3 C) 99.1 F (37.3 C)  TempSrc:      Resp: 47 52 41 37  Height:      Weight:      SpO2: 99%  99% 97%  Intubated, unresponsive to stimulus, no eye opening or motor response no gaze deviation.    Lab Results: Basic Metabolic Panel:  Recent Labs Lab 07/02/2015 0410 07/05/15 1146  NA 132* 140  K 4.0  --   CL 95*  --   CO2 23  --   GLUCOSE 149*  --   BUN 12  --   CREATININE 1.09*  --   CALCIUM 7.8*  --   MG 1.7  --   PHOS 4.7*  --     Liver Function Tests:  Recent Labs Lab 07/10/2015 0410  AST 76*  ALT 53  ALKPHOS 79  BILITOT 0.5  PROT 5.4*  ALBUMIN 2.6*   No results for input(s): LIPASE, AMYLASE in the last 168 hours. No results for input(s): AMMONIA in the last 168 hours.  CBC:  Recent Labs Lab 06/24/2015 0410  WBC 17.2*   NEUTROABS 15.6*  HGB 12.5  HCT 39.4  MCV 93.4  PLT 207    Cardiac Enzymes:  Recent Labs Lab 06/22/2015 0410 07/13/2015 1007 07/14/2015 1508  TROPONINI 0.07* 0.04* 0.04*    Lipid Panel:  Recent Labs Lab 06/26/2015 0410  TRIG 93    CBG:  Recent Labs Lab 07/18/2015 1959 07/18/2015 2341 07/05/15 0425 07/05/15 0753 07/05/15 1207  GLUCAP 116* 108* 138* 18* 44*    Microbiology: Results for orders placed or performed during the hospital encounter of 07/03/2015  MRSA PCR Screening     Status: Abnormal   Collection Time: 07/01/2015  3:24 AM  Result Value Ref Range Status   MRSA by PCR POSITIVE (A) NEGATIVE Final    Comment:        The GeneXpert MRSA Assay (FDA approved for NASAL specimens only), is one component of a comprehensive MRSA colonization surveillance program. It is not intended to diagnose MRSA infection nor to guide or monitor treatment for MRSA infections. RESULT CALLED TO, READ BACK BY AND VERIFIED WITH: R SHEMBER,RN _0  06/27/2015 MKELLY   Urine culture     Status: None   Collection Time: 07/03/2015  4:07 AM  Result Value Ref Range Status   Specimen Description URINE, RANDOM  Final   Special Requests NONE  Final   Culture NO GROWTH  Final   Report Status 07/05/2015 FINAL  Final    Imaging: Mr Jeri Cos EL Contrast  07/17/2015  CLINICAL DATA:  Cardiac arrest.   Status epilepticus and myoclonus. EXAM: MRI HEAD WITHOUT AND WITH CONTRAST TECHNIQUE: Multiplanar, multiecho pulse sequences of the brain and surrounding structures were obtained without and with intravenous contrast. CONTRAST:  63m MULTIHANCE GADOBENATE DIMEGLUMINE 529 MG/ML IV SOLN COMPARISON:  CT head 07/03/2015 RJuncal Cerebellum is diffusely swollen. There is early upward and downward herniation although the fourth ventricle is not completely effaced. There is restricted diffusion in the RIGHT and LEFT cerebellar cortex and vermis. The findings are consistent with a  disproportionate cerebellar hypoxic ischemic insult. The BILATERAL cerebral cortex does not appear normal, demonstrating loss of gray-white differentiation and mild swelling, but definite restricted diffusion is not established. The CSF around the optic nerves is prominent, with optic nerve sheath distention, and the pituitary is flattened, secondary signs of increased intracranial pressure. No intracranial mass lesion or hemorrhage. Flow voids are maintained in the carotid, basilar, and vertebral arteries. There is BILATERAL maxillary and sphenoid sinus disease. Compared with prior CT, the cerebellum appears slightly more sclerotic. IMPRESSION: Disproportionate cerebellar hypoxic ischemic insult, with restricted diffusion suggesting completed infarction and marked swelling. Early signs  of upward and downward cerebellar herniation. Abnormal cerebral cortex, early loss of gray-white differentiation, without frank ischemic insult. Secondary signs of increased intracranial pressure. Electronically Signed   By: Staci Righter M.D.   On: 06/28/2015 19:46   Dg Chest Port 1 View  07/05/2015  CLINICAL DATA:  Intubation. EXAM: PORTABLE CHEST 1 VIEW COMPARISON:  06/20/2015. FINDINGS: Left chest tube in stable position. No pneumothorax. Endotracheal tube and NG tube in stable position. Left subclavian line and left chest tube in stable position. Prior CABG. Heart size stable. Left lower lobe atelectasis and/or infiltrate. Pneumonia cannot be excluded. Chest wall subcutaneous emphysema. IMPRESSION: 1. Lines and tubes including left chest tube in stable position. No pneumothorax. Stable chest wall subcutaneous emphysema. 2. New left lower lobe atelectasis and or infiltrate. Pneumonia cannot be excluded . Electronically Signed   By: Marcello Moores  Register   On: 07/05/2015 07:44   Dg Chest Port 1 View  07/16/2015  CLINICAL DATA:  Central catheter placement EXAM: PORTABLE CHEST 1 VIEW COMPARISON:  Study obtained earlier in the day  FINDINGS: Central catheter tip is in the superior vena cava. Endotracheal tube tip is 4.2 cm above the carina. Nasogastric tube tip and side port are below the diaphragm. There is a chest tube on the left. There is no apparent pneumothorax. Soft tissue air is noted in the chest bilaterally. There is mild interstitial edema. Lungs elsewhere clear. Heart is upper normal in size with pulmonary vascularity within normal limits. No adenopathy. IMPRESSION: Tube and catheter positions as described without pneumothorax. Soft tissue air is noted bilaterally, but no pneumothorax is evident currently. Mild interstitial edema is stable. No new opacity. Stable cardiac silhouette. Electronically Signed   By: Lowella Grip III M.D.   On: 06/22/2015 17:28   Dg Chest Port 1 View  06/25/2015  CLINICAL DATA:  Tube placements post transfer from [place]. EXAM: PORTABLE CHEST 1 VIEW COMPARISON:  07/17/2015 at 0031 hours from [facility]. FINDINGS: Postoperative changes in the mediastinum. Endotracheal tube with tip measuring 4.7 cm above the carina. Enteric tube tip is off the field of view but below the left hemidiaphragm. Left chest tube is in place. There appears to been re-expansion of the left lung although visualization is limited due to subcutaneous emphysema. There is diffuse subcutaneous emphysema throughout the chest and neck bilaterally. Gas demonstrated underneath the heart may represent pneumoperitoneum or pneumomediastinum. Lungs are grossly expanded. Normal heart size and pulmonary vascularity. IMPRESSION: Appliances appear in satisfactory location. Extensive subcutaneous emphysema in the chest and neck. Electronically Signed   By: Lucienne Capers M.D.   On: 07/19/2015 03:59   Dg Abd Portable 1v  06/22/2015  CLINICAL DATA:  Abdominal distention. Known subcutaneous emphysema and pneumoperitoneum. EXAM: PORTABLE ABDOMEN - 1 VIEW COMPARISON:  CT abdomen and pelvis [facility] 07/03/2015 FINDINGS:  Prominent diffuse subcutaneous emphysema throughout the abdominal wall. Subcutaneous emphysema limits evaluation of the abdomen. Bowel gas pattern cannot be visualized. The enteric tube is present with tip in the upper abdomen consistent with location in the upper stomach. Visualized bones appear intact. IMPRESSION: Extensive subcutaneous emphysema obscures visualization of the bowel. Enteric tube appears to be located in the upper stomach. Electronically Signed   By: Lucienne Capers M.D.   On: 07/11/2015 04:57    Assessment and plan:   Gabriella Baker is an 55 y.o. female patient with clinical and electrophysiological evidence for a severe anoxic brain injury. MRI of the brain done last night showed evidence of anoxic injury involving  the cerebral hemispheres and cerebellum with possible herniation due to significant edema in the cerebellum.  Overall, very poor prognosis.

## 2015-07-05 NOTE — Consult Note (Signed)
Requesting Physician: Dr.  Vaughan Browner    Reason for consultation:  To evaluate acute encephalopathy, abnormal EEG  HPI:                                                                                                                                         Gabriella Baker is an 55 y.o. female patient seen in the ICU, intubated, sedated. Information obtained from the EMR review. No family at bedside.  Per initial H&P note, patient with medical history significant for COPD, CAD,  admitted post  asystolic cardiac arrest from [place]. Total down time is not clearly known, believed to be about 30 mins. Not cooled. Had a left pneumothorax with S.Q emphysema s/p chest tube placement. No PE as per report. A routine EEG was performed, which showed burst suppression with burst activity showing periodic Left from discharges, suggestive of severe anoxic injury. Hence neurology service consulted for further evaluation.  Past Medical History: Past Medical History  Diagnosis Date  . COPD (chronic obstructive pulmonary disease) ([place])   . Pneumonia   . Myocardial infarction ([place])   . GERD (gastroesophageal reflux disease)   . Sleep apnea   . Anginal pain ([place])   . CHF (congestive heart failure) [facility])     Past Surgical History  Procedure Laterality Date  . Coronary angioplasty    . Tonsillectomy    . Coronary artery bypass graft      Family History: No family history on file.  Social History:   reports that she has been smoking Cigarettes.  She has been smoking about 1.00 pack per day. She does not have any smokeless tobacco history on file. She reports that she drinks about 1.2 oz of alcohol per week. She reports that she does not use illicit drugs.  Allergies:  Allergies  Allergen Reactions  . Levaquin [Levofloxacin]      Medications:                                                                                                                         Current facility-administered medications:   .  0.9 %  sodium chloride infusion, 250 mL, Intravenous, PRN, Corey Harold, NP .  0.9 %  sodium chloride infusion, , Intravenous, Continuous, Corey Harold, NP, Last Rate: 75 mL/hr at 07/10/2015 0854 .  albuterol (PROVENTIL) (2.5 MG/3ML) 0.083% nebulizer solution 2.5 mg,  2.5 mg, Nebulization, Q2H PRN, Corey Harold, NP .  antiseptic oral rinse solution (CORINZ), 7 mL, Mouth Rinse, 10 times per day, Raylene Miyamoto, MD, 7 mL at 07/05/15 0530 .  azithromycin (ZITHROMAX) 500 mg in dextrose 5 % 250 mL IVPB, 500 mg, Intravenous, Q24H, Praveen Mannam, MD, 500 mg at 06/25/2015 1519 .  cefTRIAXone (ROCEPHIN) 1 g in dextrose 5 % 50 mL IVPB, 1 g, Intravenous, Q24H, Otilio Miu, RPH, 1 g at 06/20/2015 1519 .  chlorhexidine gluconate (SAGE KIT) (PERIDEX) 0.12 % solution 15 mL, 15 mL, Mouth Rinse, BID, Raylene Miyamoto, MD, 15 mL at 06/23/2015 2001 .  Chlorhexidine Gluconate Cloth 2 % PADS 6 each, 6 each, Topical, Q0600, Raylene Miyamoto, MD, 6 each at 07/05/15 0600 .  fentaNYL (SUBLIMAZE) injection 100 mcg, 100 mcg, Intravenous, Q15 min PRN, Corey Harold, NP, 100 mcg at 06/30/2015 1007 .  fentaNYL (SUBLIMAZE) injection 100 mcg, 100 mcg, Intravenous, Q2H PRN, Corey Harold, NP .  heparin injection 5,000 Units, 5,000 Units, Subcutaneous, Q8H, Corey Harold, NP, 5,000 Units at 07/05/15 0500 .  insulin aspart (novoLOG) injection 2-6 Units, 2-6 Units, Subcutaneous, Q4H, Corey Harold, NP, 2 Units at 07/05/15 [phone] .  ipratropium-albuterol (DUONEB) 0.5-2.5 (3) MG/3ML nebulizer solution 3 mL, 3 mL, Nebulization, Q6H, Corey Harold, NP, 3 mL at 07/05/15 0245 .  levETIRAcetam (KEPPRA) 2,000 mg in sodium chloride 0.9 % 100 mL IVPB, 2,000 mg, Intravenous, Q12H, Leng Montesdeoca Fuller Mandril, MD, 2,000 mg at 07/05/15 0438 .  methylPREDNISolone sodium succinate (SOLU-MEDROL) 40 mg/mL injection 40 mg, 40 mg, Intravenous, BID, Corey Harold, NP, 40 mg at 07/09/2015 2152 .  mupirocin ointment (BACTROBAN) 2 % 1  application, 1 application, Nasal, BID, Raylene Miyamoto, MD, 1 application at 28/76/81 2152 .  pantoprazole (PROTONIX) injection 40 mg, 40 mg, Intravenous, QHS, Corey Harold, NP, 40 mg at 07/12/2015 2152 .  phenytoin (DILANTIN) injection 100 mg, 100 mg, Intravenous, Q8H, Keyle Doby Fuller Mandril, MD, 100 mg at 07/05/15 0500 .  propofol (DIPRIVAN) 1000 MG/100ML infusion, 0-50 mcg/kg/min, Intravenous, Continuous, Corey Harold, NP, Last Rate: 28.8 mL/hr at 07/05/15 0441, 50 mcg/kg/min at 07/05/15 0441 .  sodium chloride flush (NS) 0.9 % injection 10-40 mL, 10-40 mL, Intracatheter, Q12H, Raylene Miyamoto, MD .  sodium chloride flush (NS) 0.9 % injection 10-40 mL, 10-40 mL, Intracatheter, PRN, Raylene Miyamoto, MD   ROS:                                                                                                                                       History  unobtainable from patient due to mental status  Neurologic Examination:  Today's Vitals   07/05/15 0400 07/05/15 0453 07/05/15 0500 07/05/15 0600  BP: 143/93  133/85 135/87  Pulse: 114  108 107  Temp: 98.2 F (36.8 C)  98.1 F (36.7 C) 98.1 F (36.7 C)  TempSrc: Core (Comment)     Resp: _0 Weight:  95 kg (209 lb 7 oz)    SpO2: 100%  100% 100%   Patient intubated, sedated. Pupils sluggishly reactive. Corneal reflex pesent.  No motor response to stimulation  Lab Results: Basic Metabolic Panel:  Recent Labs Lab 07/11/2015 0410  NA 132*  K 4.0  CL 95*  CO2 23  GLUCOSE 149*  BUN 12  CREATININE 1.09*  CALCIUM 7.8*  MG 1.7  PHOS 4.7*    Liver Function Tests:  Recent Labs Lab 06/24/2015 0410  AST 76*  ALT 53  ALKPHOS 79  BILITOT 0.5  PROT 5.4*  ALBUMIN 2.6*   No results for input(s): LIPASE, AMYLASE in the last 168 hours. No results for input(s): AMMONIA in the last 168 hours.  CBC:  Recent  Labs Lab 06/19/2015 0410  WBC 17.2*  NEUTROABS 15.6*  HGB 12.5  HCT 39.4  MCV 93.4  PLT 207    Cardiac Enzymes:  Recent Labs Lab 06/26/2015 0410 07/18/2015 1007 06/30/2015 1508  TROPONINI 0.07* 0.04* 0.04*    Lipid Panel:  Recent Labs Lab 07/16/2015 0410  TRIG 93    CBG:  Recent Labs Lab 07/05/2015 1139 07/05/2015 1528 07/06/2015 1959 06/29/2015 2341 07/05/15 0425  GLUCAP 140* 12* 116* 58* 138*    Microbiology: Results for orders placed or performed during the hospital encounter of 06/20/2015  MRSA PCR Screening     Status: Abnormal   Collection Time: 07/03/2015  3:24 AM  Result Value Ref Range Status   MRSA by PCR POSITIVE (A) NEGATIVE Final    Comment:        The GeneXpert MRSA Assay (FDA approved for NASAL specimens only), is one component of a comprehensive MRSA colonization surveillance program. It is not intended to diagnose MRSA infection nor to guide or monitor treatment for MRSA infections. RESULT CALLED TO, READ BACK BY AND VERIFIED WITH: R SHEMBER,RN _1  07/12/2015 MKELLY      Imaging: Mr Jeri Cos XL Contrast  07/17/2015  CLINICAL DATA:  Cardiac arrest.   Status epilepticus and myoclonus. EXAM: MRI HEAD WITHOUT AND WITH CONTRAST TECHNIQUE: Multiplanar, multiecho pulse sequences of the brain and surrounding structures were obtained without and with intravenous contrast. CONTRAST:  75m MULTIHANCE GADOBENATE DIMEGLUMINE 529 MG/ML IV SOLN COMPARISON:  CT head 07/03/2015 RMiner Cerebellum is diffusely swollen. There is early upward and downward herniation although the fourth ventricle is not completely effaced. There is restricted diffusion in the RIGHT and LEFT cerebellar cortex and vermis. The findings are consistent with a disproportionate cerebellar hypoxic ischemic insult. The BILATERAL cerebral cortex does not appear normal, demonstrating loss of gray-white differentiation and mild swelling, but definite restricted diffusion is not  established. The CSF around the optic nerves is prominent, with optic nerve sheath distention, and the pituitary is flattened, secondary signs of increased intracranial pressure. No intracranial mass lesion or hemorrhage. Flow voids are maintained in the carotid, basilar, and vertebral arteries. There is BILATERAL maxillary and sphenoid sinus disease. Compared with prior CT, the cerebellum appears slightly more sclerotic. IMPRESSION: Disproportionate cerebellar hypoxic ischemic insult, with restricted diffusion suggesting completed infarction and marked swelling. Early signs of upward and downward cerebellar herniation. Abnormal cerebral cortex, early loss  of gray-white differentiation, without frank ischemic insult. Secondary signs of increased intracranial pressure. Electronically Signed   By: Staci Righter M.D.   On: 07/16/2015 19:46   Dg Chest Port 1 View  07/13/2015  CLINICAL DATA:  Central catheter placement EXAM: PORTABLE CHEST 1 VIEW COMPARISON:  Study obtained earlier in the day FINDINGS: Central catheter tip is in the superior vena cava. Endotracheal tube tip is 4.2 cm above the carina. Nasogastric tube tip and side port are below the diaphragm. There is a chest tube on the left. There is no apparent pneumothorax. Soft tissue air is noted in the chest bilaterally. There is mild interstitial edema. Lungs elsewhere clear. Heart is upper normal in size with pulmonary vascularity within normal limits. No adenopathy. IMPRESSION: Tube and catheter positions as described without pneumothorax. Soft tissue air is noted bilaterally, but no pneumothorax is evident currently. Mild interstitial edema is stable. No new opacity. Stable cardiac silhouette. Electronically Signed   By: Lowella Grip III M.D.   On: 07/05/2015 17:28   Dg Chest Port 1 View  07/14/2015  CLINICAL DATA:  Tube placements post transfer from [place]. EXAM: PORTABLE CHEST 1 VIEW COMPARISON:  07/15/2015 at 0031 hours from [facility].  FINDINGS: Postoperative changes in the mediastinum. Endotracheal tube with tip measuring 4.7 cm above the carina. Enteric tube tip is off the field of view but below the left hemidiaphragm. Left chest tube is in place. There appears to been re-expansion of the left lung although visualization is limited due to subcutaneous emphysema. There is diffuse subcutaneous emphysema throughout the chest and neck bilaterally. Gas demonstrated underneath the heart may represent pneumoperitoneum or pneumomediastinum. Lungs are grossly expanded. Normal heart size and pulmonary vascularity. IMPRESSION: Appliances appear in satisfactory location. Extensive subcutaneous emphysema in the chest and neck. Electronically Signed   By: Lucienne Capers M.D.   On: 07/19/2015 03:59   Dg Abd Portable 1v  06/27/2015  CLINICAL DATA:  Abdominal distention. Known subcutaneous emphysema and pneumoperitoneum. EXAM: PORTABLE ABDOMEN - 1 VIEW COMPARISON:  CT abdomen and pelvis from [facility] 07/03/2015 FINDINGS: Prominent diffuse subcutaneous emphysema throughout the abdominal wall. Subcutaneous emphysema limits evaluation of the abdomen. Bowel gas pattern cannot be visualized. The enteric tube is present with tip in the upper abdomen consistent with location in the upper stomach. Visualized bones appear intact. IMPRESSION: Extensive subcutaneous emphysema obscures visualization of the bowel. Enteric tube appears to be located in the upper stomach. Electronically Signed   By: Lucienne Capers M.D.   On: 06/21/2015 04:57     Assessment and plan:   Zsazsa Bahena is an 55 y.o. female patient who was admitted to the ICU, post cardiac arrest, unknown downtime. Etiology for the rest is not clearly known. She has pneumothorax with a chest tube. Her EEG is highly abnormal with burst suppression pattern, and burst activity showing periodic epileptic from discharges. 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 Her propofol doses been increased due to the EEG abnormalities, ordered a loading dose of IV fosphenytoin 1500 mg now, followed by maintenance dose of phenytoin 100 mg every 8 hours, and started on Keppra maintenance dose of 2000 mg twice a day. Recommend obtaining a brain MRI for further evaluation of intracranial pathology. Hence long-term EEG could not be started  waiting for the MRI brain to be done before restarting the EEG. Further care plan based on the MRI results. We'll plan to start long-term EEG monitoring after completion of the MRI.  No family at bedside.  We'll follow-up.

## 2015-07-05 NOTE — Progress Notes (Signed)
eLink Physician-Brief Progress Note Patient Name: Gabriella PoseyRuth Baker DOB: 1960-06-22 MRN: [id number]   Date of Service  07/05/2015  HPI/Events of Note  Low K.  eICU Interventions  40 IV given.     Intervention Category Major Interventions: Other:  Samanatha Brammer 07/05/2015, 6:41 PM

## 2015-07-05 NOTE — Progress Notes (Signed)
LTM day 1 started; electrodes glued and under 5 kohms. Dr Thedore MinsSingh notified.

## 2015-07-05 NOTE — Progress Notes (Signed)
Called by A Browing from lab with BCID positive results. Patient is positive for H. Flu. On appropriate therapy with azithromycin and ceftriaxone.   Text paged CCM on call to alert them to positive blood cultures.  Yousra Ivens D. Ulani Degrasse, PharmD, BCPS Clinical Pharmacist Pager: [phone] 07/05/2015 8:03 PM

## 2015-07-05 NOTE — Progress Notes (Signed)
Spoke with Belenda CruiseKristin RN in elink regarding patient lab results: sodium 142 (still not within desired range) and potassium 2.9.  She will relay this information to Dr. Molli KnockYacoub.  Will continue to monitor pt closely.

## 2015-07-05 NOTE — Progress Notes (Signed)
Dr. Molli KnockYacoub notified of urine output 25-30 cc per hour. Also notified of new onset lower extremity jerking.  Will continue to monitor pt closely.

## 2015-07-05 NOTE — Progress Notes (Signed)
eLink Physician-Brief Progress Note Patient Name: Gabriella PoseyRuth Baker DOB: [birth year] MRN: [id number]   Date of Service  07/05/2015  HPI/Events of Note  usudden HR jump 80 to 120 sinus diminhsed ur op  eICU Interventions  Fluid bolus 500cc cxr     Intervention Category Major Interventions: Arrhythmia - evaluation and management  Karson Reede 07/05/2015, 4:05 AM

## 2015-07-05 NOTE — Progress Notes (Signed)
PULMONARY / CRITICAL CARE MEDICINE   Name: Gabriella Baker MRN: [id number] DOB: 09/23/1960    ADMISSION DATE:  December 17, 2015 CONSULTATION DATE:  December 17, 2015  REFERRING MD:  Gabriella [facility]  CHIEF COMPLAINT:  Cardiac arrest  HISTORY OF PRESENT ILLNESS:  Patient is encephalopathic and/or intubated. Therefore history has been obtained from chart review.  55 year old female with PMH COPD and CAD. Has been feeling "unwell" for about a week. Thought she had pneumonia with SOB. 5/15 she suffered a witnessed cardiac arrest. Bystander called EMS, but did not initiate CPR. EMS arrived about 10 to 15 mins later. They noted her to be asystolic on the cardiac monitor. They started ACLS while en route to the hospital which required about 25 mins ACLS until ROSC. Gabriella Baker was placed and she was difficult to bag. Upon arrival to ED Gabriella Baker was replaced, which was a difficult transition. She was then found to have subcutaneous air and tension pneumothorax. Reportedly needle decompression was attempted, but location was incorrect and subclavian artery was punctured. Chest tube was then placed to L chest. Patient was transferred to [facility] for ICU admission. Myoclonus noted en route.    PAST MEDICAL HISTORY :  She  has a past medical history of COPD (chronic obstructive pulmonary disease) (HCC); Pneumonia; Myocardial infarction [facility](HCC); GERD (gastroesophageal reflux disease); Sleep apnea; Anginal pain (HCC); and CHF (congestive heart failure) (HCC).  PAST SURGICAL HISTORY: She  has past surgical history that includes Coronary angioplasty; Tonsillectomy; and Coronary artery bypass graft.  Allergies  Allergen Reactions  . Levaquin [Levofloxacin]     No current facility-administered medications on file prior to encounter.   No current outpatient prescriptions on file prior to encounter.    FAMILY HISTORY:  Her has no family status information on file.   SOCIAL HISTORY: She  reports that she has been smoking  Cigarettes.  She has been smoking about 1.00 pack per day. She does not have any smokeless tobacco history on file. She reports that she drinks about 1.2 oz of alcohol per week. She reports that she does not use illicit drugs.  REVIEW OF SYSTEMS:   Unable  SUBJECTIVE:   VITAL SIGNS: BP 133/80 mmHg  Pulse 114  Temp(Src) 98.2 F (36.8 C) (Core (Comment))  Resp 24  Wt 209 lb 7 oz (95 kg)  SpO2 98%  LMP  (LMP Unknown)  HEMODYNAMICS:    VENTILATOR SETTINGS: Vent Mode:  [-] PRVC FiO2 (%):  [40 %-60 %] 60 % Set Rate:  [28 bmp] 28 bmp Vt Set:  [50 mL-450 mL] 50 mL PEEP:  [5 cmH20] 5 cmH20 Plateau Pressure:  [27 cmH20-28 cmH20] 28 cmH20  INTAKE / OUTPUT: I/O last 3 completed shifts: In: 4334.6 [I.V.:2614.6; IV Piggyback:1720] Out: 3375 [Urine:2600; Emesis/NG output:750; Chest Tube:25]  PHYSICAL EXAMINATION: General:  No distress.  Neuro:  Comatose, unresponsive.  HEENT:  Moist mucus membranes, No thyromegaly, JVD Cardiovascular:  RRR, no MRG Lungs:  SuQ emphysema improving. Abdomen:  Distended, + BS Musculoskeletal:  No acute deformity Skin:  Intact  LABS:  BMET  Recent Labs Lab 09-18-15 0410  NA 132*  K 4.0  CL 95*  CO2 23  BUN 12  CREATININE 1.09*  GLUCOSE 149*    Electrolytes  Recent Labs Lab 09-18-15 0410  CALCIUM 7.8*  MG 1.7  PHOS 4.7*    CBC  Recent Labs Lab 09-18-15 0410  WBC 17.2*  HGB 12.5  HCT 39.4  PLT 207    Coag's  Recent  Labs Lab 07-Jul-2015 0410  INR 1.15    Sepsis Markers  Recent Labs Lab 07/13/2015 0420 07/08/2015 1508 07/05/15 0250  LATICACIDVEN 1.5  --   --   PROCALCITON  --  2.72 2.37    ABG  Recent Labs Lab 06/19/2015 0418 07/03/2015 1240 07/05/15 0902  PHART 7.265* 7.355 7.347*  PCO2ART 60.8* 46.6* 49.0*  PO2ART 379* 60.0* 97.6    Liver Enzymes  Recent Labs Lab 06/22/2015 0410  AST 76*  ALT 53  ALKPHOS 79  BILITOT 0.5  ALBUMIN 2.6*    Cardiac Enzymes  Recent Labs Lab 07/17/2015 0410  Jul 07, 2015 1007 07/06/2015 1508  TROPONINI 0.07* 0.04* 0.04*    Glucose  Recent Labs Lab 06/24/2015 1139 07/15/2015 1528 07/16/2015 1959 06/30/2015 2341 07/05/15 0425 07/05/15 0753  GLUCAP 140* 207* 116* 108* 138* 129*    Imaging Mr Brain W Wo Contrast  06/20/2015  CLINICAL DATA:  Cardiac arrest.   Status epilepticus and myoclonus. EXAM: MRI HEAD WITHOUT AND WITH CONTRAST TECHNIQUE: Multiplanar, multiecho pulse sequences of the brain and surrounding structures were obtained without and with intravenous contrast. CONTRAST:  20mL MULTIHANCE GADOBENATE DIMEGLUMINE 529 MG/ML IV SOLN COMPARISON:  CT head 07/03/2015 [facility]. FINDINGS: Cerebellum is diffusely swollen. There is early upward and downward herniation although the fourth ventricle is not completely effaced. There is restricted diffusion in the RIGHT and LEFT cerebellar cortex and vermis. The findings are consistent with a disproportionate cerebellar hypoxic ischemic insult. The BILATERAL cerebral cortex does not appear normal, demonstrating loss of gray-white differentiation and mild swelling, but definite restricted diffusion is not established. The CSF around the optic nerves is prominent, with optic nerve sheath distention, and the pituitary is flattened, secondary signs of increased intracranial pressure. No intracranial mass lesion or hemorrhage. Flow voids are maintained in the carotid, basilar, and vertebral arteries. There is BILATERAL maxillary and sphenoid sinus disease. Compared with prior CT, the cerebellum appears slightly more sclerotic. IMPRESSION: Disproportionate cerebellar hypoxic ischemic insult, with restricted diffusion suggesting completed infarction and marked swelling. Early signs of upward and downward cerebellar herniation. Abnormal cerebral cortex, early loss of gray-white differentiation, without frank ischemic insult. Secondary signs of increased intracranial pressure. Electronically Signed   By: Elsie Stain  M.D.   On: 07/01/2015 19:46   Dg Chest Port 1 View  07/05/2015  CLINICAL DATA:  Intubation. EXAM: PORTABLE CHEST 1 VIEW COMPARISON:  07/09/2015. FINDINGS: Left chest tube in stable position. No pneumothorax. Endotracheal tube and NG tube in stable position. Left subclavian line and left chest tube in stable position. Prior CABG. Heart size stable. Left lower lobe atelectasis and/or infiltrate. Pneumonia cannot be excluded. Chest wall subcutaneous emphysema. IMPRESSION: 1. Lines and tubes including left chest tube in stable position. No pneumothorax. Stable chest wall subcutaneous emphysema. 2. New left lower lobe atelectasis and or infiltrate. Pneumonia cannot be excluded . Electronically Signed   By: Maisie Fus  Register   On: 07/05/2015 07:44   Dg Chest Port 1 View  06/30/2015  CLINICAL DATA:  Central catheter placement EXAM: PORTABLE CHEST 1 VIEW COMPARISON:  Study obtained earlier in the day FINDINGS: Central catheter tip is in the superior vena cava. Endotracheal tube tip is 4.2 cm above the carina. Nasogastric tube tip and side port are below the diaphragm. There is a chest tube on the left. There is no apparent pneumothorax. Soft tissue air is noted in the chest bilaterally. There is mild interstitial edema. Lungs elsewhere clear. Heart is upper normal in size with pulmonary vascularity  within normal limits. No adenopathy. IMPRESSION: Tube and catheter positions as described without pneumothorax. Soft tissue air is noted bilaterally, but no pneumothorax is evident currently. Mild interstitial edema is stable. No new opacity. Stable cardiac silhouette. Electronically Signed   By: Bretta Bang III M.D.   On: 07/14/2015 17:28     STUDIES:  CTA chest 5/15 > no PE, large L PTX with associated pneumomediastinum and pneumoperitoneum. Subq emphysema  CT head 5/15 > cerebral edema MRI head 07/05/15 > severe anoxic injury, impending herniation.   CULTURES: BC 5/16 >  Urine 5/16 >    ANTIBIOTICS: None  SIGNIFICANT EVENTS: 5/15 Cardiac arrest  LINES/TUBES: ETT 5/15 >  DISCUSSION: 18 F s/p asystolic cardiac arrest. Downtime estimated just over 30 mins. Severe anoxic injury  ASSESSMENT / PLAN:  PULMONARY A: Inability to protect Baker secondary to cardiac arrest Acute hypercarbic respiratory failure COPD with suspected exacerbation L PTX Pneumomediatstinum  P:   Full vent support Now weaning. Abx as above Nebs IV steroids CT to suction 20 cmH20  CARDIOVASCULAR A:  Cardiac arrest asystole H/o CAD Pneumomediastinum  P:  Telemetry monitoring MAP goal >  RENAL A:   Hyponatremia likelySIADH  P:   Follow urine output and Cr  GASTROINTESTINAL A:   Abdominal distension, likely due to bagging Gabriella Baker. Pneumoperitoneum likely secondary to large PTX  P:   KUB now NPO tonight Protonix  HEMATOLOGIC A:   ? PE (d-dimer at [place] > 8000), d/w radiolgy, no PE demonstrated. P:  No PE on CT scan  INFECTIOUS A:   CAP P:   Monitor WBC and fever curve On abx for CAP  ENDOCRINE A:   Hyperglycemia with questionable history of DM  P:   CBG monitoring and SSI  NEUROLOGIC A:   Acute anoxic encephalopathy MRI noted with cerebral edema from anoxic injury, elevated ICP and early cerebellar herniation.  Status epilepticus  P:  Take off sedation Repeat EEG today. 3% saline Will have a family meeting today to decide on goals of care.   FAMILY  - Updates: son 5/16 early AM via phone. She is legally married to wife dawn, but they are separated. Will need to further explore who is the decision maker. Full code.  - Inter-disciplinary family meet or Palliative Care meeting due by:  5/23.  Interdisciplinary Goals of Care Family Meeting Date carried out:: 07/05/2015  Location of the meeting: Conference room  Member's involved: Physician, Bedside Registered Nurse and Family Member or next of kin  Durable Power of Attorney  or acting medical decision maker: Sons    Discussion: We discussed goals of care for Catina Nuss .  I brought them uptodate with events and studies. Told them that she has severe anoxic injury with brain edema and herniation. Changes of recovery are very poor. They agree that she would not want to be supported in this way is she is in a vegetative state.  She is now DNR. We will give her a few more day and reassess on Friday. They are aware that she may deteriorate quickly. If this should happen then they will be ok with comfort care.   Code status: Limited Code or DNR with short term  Disposition: Continue current acute care  Time spent for the meeting: 15  Marcelina Mclaurin 07/05/2015 1:09 PM  Critical care time- 35 mins.  Chilton Greathouse MD [place] [facility] Pager [phone] If no answer or after 3pm call: [phone] 07/05/2015, 11:09  AM

## 2015-07-05 NOTE — Progress Notes (Signed)
Initial Nutrition Assessment  DOCUMENTATION CODES:   Obesity unspecified  INTERVENTION:  If nutrition support within GOC, recommend initiation of Vital High Protein via NGT @ goal rate of 10 ml/hr (240 ml/d) with 60 ml Prostat QID. Tube feeding + propofol will provide 1800 kcals and 141 g of protein (meets >100% needs).    NUTRITION DIAGNOSIS:   Inadequate oral intake related to inability to eat as evidenced by NPO status.  GOAL:   Provide needs based on ASPEN/SCCM guidelines  MONITOR:   Vent status, Labs, Weight trends, Skin, I & O's  REASON FOR ASSESSMENT:   Ventilator    ASSESSMENT:   Pt with COPD, CAD admitted with asystolic arrest. Total down time about 30 mins. Not cooled. Had a lt ptx with S.Q emphysema s/p chest tube placement.   Pt on vent support.  Temp (24hrs), Avg:98.1 F (36.7 C), Min:97.5 F (36.4 C), Max:98.4 F (36.9 C) Propofol running @ 28.8 ml/hr (providing 760 kcals)  NFPE: no muscle depletion, no fat depletion, no edema.  Labs reviewed; CBGs 108-207.  Meds reviewed;    Diet Order:  Diet NPO time specified  Skin:  Reviewed, no issues  Last BM:  unknown  Height:   Ht Readings from Last 1 Encounters:  07/05/15 5\' 7"  (1.702 m)    Weight:   Wt Readings from Last 1 Encounters:  07/05/15 209 lb 7 oz (95 kg)    Ideal Body Weight:  61 kg  BMI:  Body mass index is 32.79 kg/(m^2).  Estimated Nutritional Needs:   Kcal:  [id number]  Protein:  122  Fluid:  Per MD  EDUCATION NEEDS:   No education needs identified at this time  Beryle QuantMeredith Doreena Maulden, MS [facility] Dietetic Intern Pager [phone]

## 2015-07-05 NOTE — Clinical Documentation Improvement (Signed)
Critical Care  Abnormal diagnostic findings (MRI scans, CT scans, tec.) are not coded and reported unless the physician indicates their clinical significance.  Possible Clinical Conditions:  Cerebral Edema, Cytoxic Edema, Vasogenic Edema, including suspected or known cause and associated condition(s).  Herniation, including type - uncal, transtentorial, or other type of herniation, including suspected or known cause and associated condition(s).  Other  Clinically Undetermined  Document any associated diagnoses/conditions. Supporting Information: 06/28/2015  MRI HEAD WITHOUT AND WITH CONTRAST IMPRESSION: Disproportionate cerebellar hypoxic ischemic insult, with restricted diffusion suggesting completed infarction and marked swelling. Early signs of upward and downward cerebellar herniation.  Abnormal cerebral cortex, early loss of gray-white differentiation, without frank ischemic insult. Secondary signs of increased intracranial pressure.  Please exercise your independent, professional judgment when responding. A specific answer is not anticipated or expected. Please update your documentation within the medical record to reflect your response to this query. Thank you  Thank Barrie DunkerYou,  [place]atham Howington C Jilda Kress Health Information Management [place] [phone]

## 2015-07-06 ENCOUNTER — Inpatient Hospital Stay (HOSPITAL_COMMUNITY): Payer: Medicaid Other

## 2015-07-06 LAB — GLUCOSE, CAPILLARY
GLUCOSE-CAPILLARY: 119 mg/dL — AB (ref 65–99)
GLUCOSE-CAPILLARY: 136 mg/dL — AB (ref 65–99)
GLUCOSE-CAPILLARY: 186 mg/dL — AB (ref 65–99)
GLUCOSE-CAPILLARY: 105 mg/dL — AB (ref 65–99)
Glucose-Capillary: 111 mg/dL — ABNORMAL HIGH (ref 65–99)

## 2015-07-06 LAB — BLOOD GAS, ARTERIAL
ACID-BASE EXCESS: 1.8 mmol/L (ref 0.0–2.0)
BICARBONATE: 26 meq/L — AB (ref 20.0–24.0)
Drawn by: 10006
FIO2: 0.4
LHR: 28 {breaths}/min
O2 Saturation: 93.2 %
PEEP/CPAP: 5 cmH2O
PO2 ART: 72.2 mmHg — AB (ref 80.0–100.0)
Patient temperature: 100.6
TCO2: 27.2 mmol/L (ref 0–100)
VT: 450 mL
pCO2 arterial: 43.7 mmHg (ref 35.0–45.0)
pH, Arterial: 7.398 (ref 7.350–7.450)

## 2015-07-06 LAB — CBC
HEMATOCRIT: 33.9 % — AB (ref 36.0–46.0)
Hemoglobin: 10.8 g/dL — ABNORMAL LOW (ref 12.0–15.0)
MCH: 29.4 pg (ref 26.0–34.0)
MCHC: 31.9 g/dL (ref 30.0–36.0)
MCV: 92.4 fL (ref 78.0–100.0)
Platelets: 218 10*3/uL (ref 150–400)
RBC: 3.67 MIL/uL — AB (ref 3.87–5.11)
RDW: 14.5 % (ref 11.5–15.5)
WBC: 11.8 10*3/uL — AB (ref 4.0–10.5)

## 2015-07-06 LAB — BASIC METABOLIC PANEL
ANION GAP: 9 (ref 5–15)
Anion gap: 11 (ref 5–15)
BUN: 9 mg/dL (ref 6–20)
BUN: 11 mg/dL (ref 6–20)
CALCIUM: 7.9 mg/dL — AB (ref 8.9–10.3)
CO2: 26 mmol/L (ref 22–32)
CO2: 27 mmol/L (ref 22–32)
CREATININE: 0.66 mg/dL (ref 0.44–1.00)
Calcium: 8.2 mg/dL — ABNORMAL LOW (ref 8.9–10.3)
Chloride: 111 mmol/L (ref 101–111)
Chloride: 113 mmol/L — ABNORMAL HIGH (ref 101–111)
Creatinine, Ser: 0.65 mg/dL (ref 0.44–1.00)
GFR calc Af Amer: >60 mL/min (ref >60)
GFR calc non Af Amer: >60 mL/min (ref >60)
GLUCOSE: 133 mg/dL — AB (ref 65–99)
Glucose, Bld: 178 mg/dL — ABNORMAL HIGH (ref 65–99)
POTASSIUM: 3.3 mmol/L — AB (ref 3.5–5.1)
Potassium: 2.6 mmol/L — CL (ref 3.5–5.1)
SODIUM: 151 mmol/L — AB (ref 135–145)
Sodium: 146 mmol/L — ABNORMAL HIGH (ref 135–145)

## 2015-07-06 LAB — PHOSPHORUS: Phosphorus: 1.8 mg/dL — ABNORMAL LOW (ref 2.5–4.6)

## 2015-07-06 LAB — MAGNESIUM: Magnesium: 1.9 mg/dL (ref 1.7–2.4)

## 2015-07-06 LAB — SODIUM
Sodium: 149 mmol/L — ABNORMAL HIGH (ref 135–145)
Sodium: 155 mmol/L — ABNORMAL HIGH (ref 135–145)

## 2015-07-06 LAB — PROCALCITONIN: Procalcitonin: 1.54 ng/mL

## 2015-07-06 MED ORDER — FUROSEMIDE 10 MG/ML IJ SOLN
20.0000 mg | Freq: Once | INTRAMUSCULAR | Status: AC
  Administered 2015-07-06: 20 mg via INTRAVENOUS
  Filled 2015-07-06: qty 2

## 2015-07-06 MED ORDER — POTASSIUM CHLORIDE 20 MEQ/15ML (10%) PO SOLN
40.0000 meq | Freq: Once | ORAL | Status: AC
  Administered 2015-07-07: 40 meq
  Filled 2015-07-06: qty 30

## 2015-07-06 MED ORDER — VITAL HIGH PROTEIN PO LIQD
1000.0000 mL | ORAL | Status: DC
  Administered 2015-07-06: 1000 mL

## 2015-07-06 MED ORDER — MAGNESIUM SULFATE 2 GM/50ML IV SOLN
2.0000 g | Freq: Once | INTRAVENOUS | Status: AC
  Administered 2015-07-06: 2 g via INTRAVENOUS
  Filled 2015-07-06: qty 50

## 2015-07-06 MED ORDER — SODIUM PHOSPHATES 45 MMOLE/15ML IV SOLN
30.0000 mmol | Freq: Once | INTRAVENOUS | Status: AC
  Administered 2015-07-06: 30 mmol via INTRAVENOUS
  Filled 2015-07-06: qty 10

## 2015-07-06 MED ORDER — POTASSIUM CHLORIDE 10 MEQ/50ML IV SOLN
10.0000 meq | INTRAVENOUS | Status: AC
  Administered 2015-07-06 (×4): 10 meq via INTRAVENOUS
  Filled 2015-07-06 (×4): qty 50

## 2015-07-06 MED ORDER — PRO-STAT SUGAR FREE PO LIQD
60.0000 mL | Freq: Four times a day (QID) | ORAL | Status: DC
  Administered 2015-07-06 – 2015-07-07 (×4): 60 mL via ORAL
  Filled 2015-07-06 (×4): qty 60

## 2015-07-06 MED ORDER — POTASSIUM CHLORIDE 10 MEQ/100ML IV SOLN: 10.0000 meq | INTRAVENOUS | Status: DC

## 2015-07-06 MED ORDER — DEXTROSE 5 % IV SOLN
2.0000 g | INTRAVENOUS | Status: DC
  Administered 2015-07-06 – 2015-07-07 (×2): 2 g via INTRAVENOUS
  Filled 2015-07-06 (×3): qty 2

## 2015-07-06 MED ORDER — POTASSIUM CHLORIDE 20 MEQ/15ML (10%) PO SOLN
40.0000 meq | Freq: Once | ORAL | Status: AC
  Administered 2015-07-06: 40 meq
  Filled 2015-07-06: qty 30

## 2015-07-06 NOTE — Progress Notes (Addendum)
eLink Physician-Brief Progress Note Patient Name: Gabriella PoseyRuth Krotzer DOB: [birth year] MRN: [id number]   Date of Service  07/06/2015  HPI/Events of Note  Hypokalemia severe with ongoing diuresis Estimated Creatinine Clearance: 97.6 mL/min (by C-G formula based on Cr of 0.66).   eICU Interventions  kcl x 40 meq per ft x 2      Intervention Category Major Interventions: Electrolyte abnormality - evaluation and management  Sandrea HughsMichael Coreena Rubalcava 07/06/2015, 6:17 PM

## 2015-07-06 NOTE — Progress Notes (Signed)
[facility] FOR AM LAB REPLACEMENT ONLY  The patient does not apply for the [facility] Electrolyte [facility] Protocol based on the criteria listed below:     Is urine output >/= 0.5 ml/kg/hr for the last 6 hours? No. Patient's UOP is 0.2 ml/kg/hr  Abnormal electrolyte(s): K3.3   If a panic level lab has been reported, has the CCM MD in charge been notified? Yes.  .   Physician:  Ginnie Smart Kasa, MD  [name], Gabriella Baker William 07/06/2015 6:10 AM

## 2015-07-06 NOTE — Progress Notes (Addendum)
PULMONARY / CRITICAL CARE MEDICINE   Name: Gabriella PoseyRuth Gillooly MRN: [id number] DOB: 1960/11/04    ADMISSION DATE:  2015-06-15 CONSULTATION DATE:  2015-06-15  REFERRING MD:  [facility]  CHIEF COMPLAINT:  Cardiac arrest  HISTORY OF PRESENT ILLNESS:  Patient is encephalopathic and/or intubated. Therefore history has been obtained from chart review.  55 year old female with PMH COPD and CAD. Has been feeling "unwell" for about a week. Thought she had pneumonia with SOB. 5/15 she suffered a witnessed cardiac arrest. Bystander called EMS, but did not initiate CPR. EMS arrived about 10 to 15 mins later. They noted her to be asystolic on the cardiac monitor. They started ACLS while en route to the hospital which required about 25 mins ACLS until ROSC. King airway was placed and she was difficult to bag. Upon arrival to ED king airway was replaced, which was a difficult transition. She was then found to have subcutaneous air and tension pneumothorax. Reportedly needle decompression was attempted, but location was incorrect and subclavian artery was punctured. Chest tube was then placed to L chest. Patient was transferred to [facility] for ICU admission. Myoclonus noted en route.    PAST MEDICAL HISTORY :  She  has a past medical history of COPD (chronic obstructive pulmonary disease) (HCC); Pneumonia; Myocardial infarction [facility](HCC); GERD (gastroesophageal reflux disease); Sleep apnea; Anginal pain (HCC); and CHF (congestive heart failure) (HCC).  PAST SURGICAL HISTORY: She  has past surgical history that includes Coronary angioplasty; Tonsillectomy; and Coronary artery bypass graft.  Allergies  Allergen Reactions  . Levaquin [Levofloxacin]     No current facility-administered medications on file prior to encounter.   No current outpatient prescriptions on file prior to encounter.    FAMILY HISTORY:  Her has no family status information on file.   SOCIAL HISTORY: She  reports that she has been smoking  Cigarettes.  She has been smoking about 1.00 pack per day. She does not have any smokeless tobacco history on file. She reports that she drinks about 1.2 oz of alcohol per week. She reports that she does not use illicit drugs.  REVIEW OF SYSTEMS:   Unable  SUBJECTIVE:   VITAL SIGNS: BP 114/64 mmHg  Pulse 104  Temp(Src) 99.5 F (37.5 C) (Core (Comment))  Resp 28  Ht 5\' 7"  (1.702 m)  Wt 215 lb 6.2 oz (97.7 kg)  BMI 33.73 kg/m2  SpO2 95%  LMP  (LMP Unknown)  HEMODYNAMICS:    VENTILATOR SETTINGS: Vent Mode:  [-] PRVC FiO2 (%):  [40 %-60 %] 40 % Set Rate:  [28 bmp] 28 bmp Vt Set:  [450 mL] 450 mL PEEP:  [5 cmH20] 5 cmH20 Plateau Pressure:  [21 cmH20-28 cmH20] 28 cmH20  INTAKE / OUTPUT: I/O last 3 completed shifts: In: 4869.9 [I.V.:3389.9; IV Piggyback:1480] Out: 3375 [Urine:2510; Emesis/NG output:850; Chest Tube:15]  PHYSICAL EXAMINATION: General:  No distress.  Neuro:  Comatose, unresponsive.  HEENT:  Moist mucus membranes, No thyromegaly, JVD Cardiovascular:  RRR, no MRG Lungs:  SuQ emphysema improving. Abdomen:  Distended, + BS Musculoskeletal:  No acute deformity Skin:  Intact  LABS:  BMET  Recent Labs Lab 10/31/2015 0410  07/05/15 1510 07/05/15 2115 07/06/15 0215 07/06/15 0935  NA 132*  < > 142 145 146* 149*  K 4.0  --  2.9*  --  3.3*  --   CL 95*  --  105  --  111  --   CO2 23  --  27  --  26  --  BUN 12  --  8  --  9  --   CREATININE 1.09*  --  0.68  --  0.65  --   GLUCOSE 149*  --  206*  --  133*  --   < > = values in this interval not displayed.  Electrolytes  Recent Labs Lab 22-Jul-2015 0410 07/05/15 1510 07/06/15 0215  CALCIUM 7.8* 8.3* 8.2*  MG 1.7  --  1.9  PHOS 4.7*  --  1.8*    CBC  Recent Labs Lab 22-Jul-2015 0410 07/06/15 0215  WBC 17.2* 11.8*  HGB 12.5 10.8*  HCT 39.4 33.9*  PLT 207 218    Coag's  Recent Labs Lab 07/22/2015 0410  INR 1.15    Sepsis Markers  Recent Labs Lab 07-22-15 0420 2015-07-22 1508  07/05/15 0250 07/06/15 0215  LATICACIDVEN 1.5  --   --   --   PROCALCITON  --  2.72 2.37 1.54    ABG  Recent Labs Lab July 22, 2015 1240 07/05/15 0902 07/06/15 0345  PHART 7.355 7.347* 7.398  PCO2ART 46.6* 49.0* 43.7  PO2ART 60.0* 97.6 72.2*    Liver Enzymes  Recent Labs Lab July 22, 2015 0410  AST 76*  ALT 53  ALKPHOS 79  BILITOT 0.5  ALBUMIN 2.6*    Cardiac Enzymes  Recent Labs Lab 07-22-2015 0410 07/22/15 1007 07/22/15 1508  TROPONINI 0.07* 0.04* 0.04*    Glucose  Recent Labs Lab 07/05/15 1207 07/05/15 1615 07/05/15 1944 07/05/15 2344 07/06/15 0512 07/06/15 0748  GLUCAP 133* 157* 117* 98 111* 119*    Imaging Dg Chest Port 1 View  07/06/2015  CLINICAL DATA:  Acute respiratory failure EXAM: PORTABLE CHEST 1 VIEW COMPARISON:  Portable chest x-ray of Jul 05, 2015 FINDINGS: The lungs are well-expanded. The interstitial markings remain increased but are slightly less conspicuous especially on the left today. The cardiac silhouette remains enlarged. The central pulmonary vascularity is prominent. The left-sided chest tube is stable with the tip projecting over the posterior lateral aspect of the third rib. No pneumothorax is evident. The left subclavian venous catheter tip projects over the midportion of the SVC. The endotracheal tube tip lies 4 cm above the carina. The esophagogastric tube tip projects below the inferior margin of the image. IMPRESSION: Slight interval improvement in the pulmonary interstitium especially on the left. Persistent interstitial edema and mild cardiomegaly. No pneumothorax. The support tubes are in reasonable position. Electronically Signed   By: David  [place] M.D.   On: 07/06/2015 07:24     STUDIES:  CTA chest 5/15 > no PE, large L PTX with associated pneumomediastinum and pneumoperitoneum. Subq emphysema  CT head 5/15 > cerebral edema MRI head 07/05/15 > severe anoxic injury, impending herniation.   CULTURES: BC 5/16 >  Urine 5/16 >    ANTIBIOTICS: None  SIGNIFICANT EVENTS: 5/15 Cardiac arrest  LINES/TUBES: ETT 5/15 >  DISCUSSION: 54 F s/p asystolic cardiac arrest. Downtime estimated just over 30 mins. Severe anoxic injury  ASSESSMENT / PLAN:  PULMONARY A: Inability to protect airway secondary to cardiac arrest Acute hypercarbic respiratory failure COPD with suspected exacerbation L PTX Pneumomediatstinum  P:   Full vent support No weaning due to poor mental status.  Abx as above Nebs IV steroids CT to suction 20 cmH20  CARDIOVASCULAR A:  Cardiac arrest asystole H/o CAD Pneumomediastinum  P:  Telemetry monitoring MAP goal > Lasix 20 mg once to keep I/O even  RENAL A:   Hyponatremia likelySIADH. Now with increased Na on hypertonic saline.  P:   Follow urine output and Cr  GASTROINTESTINAL A:   Abdominal distension, likely due to bagging king airway. Pneumoperitoneum likely secondary to large PTX  P:   Start tube feeds NPO tonight Protonix  HEMATOLOGIC A:   ? PE (d-dimer at [place] > 8000), d/w radiolgy, no PE demonstrated. P:  No PE on CT scan  INFECTIOUS A:   CAP H. Flu bacteremia P:   Monitor WBC and fever curve On abx for CAP , H Flu  ENDOCRINE A:   Hyperglycemia with questionable history of DM  P:   CBG monitoring and SSI  NEUROLOGIC A:   Acute anoxic encephalopathy MRI noted with cerebral edema from anoxic injury, elevated ICP and early cerebellar herniation.  Status epilepticus  P:  Take off sedation Continuous EEG 3% saline   FAMILY  - Updates: son 5/16 early AM via phone. She is legally married to wife dawn, but they are separated. Will need to further explore who is the decision maker. Full code.  - Inter-disciplinary family meet or Palliative Care meeting due by:  5/23.  Interdisciplinary Goals of Care Family Meeting Date carried out: 07/05/15  Location of the meeting: Conference room  Member's involved: Physician, Bedside  Registered Nurse and Family Member or next of kin  Durable Power of Attorney or acting medical decision maker: Sons    Discussion: We discussed goals of care for Gia Lusher .  I brought them uptodate with events and studies. Told them that she has severe anoxic injury with brain edema and herniation. Changes of recovery are very poor. They agree that she would not want to be supported in this way is she is in a vegetative state.  She is now DNR. We will give her a few more day and reassess on Friday. They are aware that she may deteriorate quickly. If this should happen then they will be ok with comfort care.   Code status: Limited Code or DNR with short term  Disposition: Continue current acute care  Time spent for the meeting: 15  Delona Clasby 07/06/2015 10:43 AM  Critical care time- 35 mins.  Chilton Greathouse MD [place] [facility] Pager [phone] If no answer or after 3pm call: [phone] 07/06/2015, 10:43 AM

## 2015-07-06 NOTE — Progress Notes (Signed)
eLink Physician-Brief Progress Note Patient Name: Gabriella PoseyRuth Baker DOB: [birth year] MRN: [id number]   Date of Service  07/06/2015  HPI/Events of Note  K=3.3, mg=1.9, phos 1.8  eICU Interventions  Replaced      Intervention Category Minor Interventions: Electrolytes abnormality - evaluation and management  Gabriella Baker 07/06/2015, 6:28 AM

## 2015-07-06 NOTE — Progress Notes (Signed)
Pt's clothing and ID card given to pt's family Judie Grieve( Bryan: son)

## 2015-07-06 NOTE — Progress Notes (Signed)
CRITICAL VALUE ALERT  Critical value received:  K= 2.6  Date of notification: 07/06/2015  Time of notification:  1759  Critical value read back:Yes  Nurse who received alert:  O. Maximina Pirozzi  MD notified (1st page):  Dr. Sherene SiresWert  Time of first page:  1806  MD notified (2nd page):  Time of second page:  Responding MD: Dr. Sherene SiresWert  Time MD responded:  [id number]

## 2015-07-06 NOTE — Progress Notes (Signed)
Nutrition Follow-up  DOCUMENTATION CODES:   Obesity unspecified  INTERVENTION:   Initiate Vital High Protein @ 10 ml/hr via NG tube   60 ml Prostat QID.    Tube feeding regimen provides 1040 kcal, 141 grams of protein, and 200 ml of H2O.  TF regimen and propofol at current rate providing 1800 total kcal/day   NUTRITION DIAGNOSIS:   Inadequate oral intake related to inability to eat as evidenced by NPO status. Ongoing.   GOAL:   Provide needs based on ASPEN/SCCM guidelines Progressing.   MONITOR:   Vent status, Labs, Weight trends, Skin, I & O's  REASON FOR ASSESSMENT:   Consult Enteral/tube feeding initiation and management  ASSESSMENT:   Pt with COPD, CAD admitted with asystolic arrest. Total down time about 30 mins. Not cooled. Had a lt ptx with S.Q emphysema s/p chest tube placement.   Patient is currently intubated on ventilator support MV: 13.3 L/min Temp (24hrs), Avg:99.8 F (37.7 C), Min:97.7 F (36.5 C), Max:101.1 F (38.4 C)  Propofol: 28.8 ml/hr provides 760 kcal per day from lipid Labs reviewed: sodium elevated 149 CBG's: 111-136 NG tube, tip in stomach MD meeting with family for plan of care.   Diet Order:  Diet NPO time specified  Skin:  Reviewed, no issues  Last BM:  unknown  Height:   Ht Readings from Last 1 Encounters:  07/05/15 5\' 7"  (1.702 m)    Weight:   Wt Readings from Last 1 Encounters:  07/06/15 215 lb 6.2 oz (97.7 kg)    Ideal Body Weight:  61 kg  BMI:  Body mass index is 33.73 kg/(m^2).  Estimated Nutritional Needs:   Kcal:  [phone]  Protein:  122  Fluid:  Per MD  EDUCATION NEEDS:   No education needs identified at this time  Kendell BaneHeather Vila Dory RD, LDN, CNSC [phone] Pager [phone] After Hours Pager

## 2015-07-06 NOTE — Procedures (Signed)
Electroencephalogram report- LTM  Ordering Physician : Dr. Lavon PaganiniNandigam EEG number: [id number]    Beginning date or time: 07/05/2015 9:08AM Ending date or time: 07/06/2015 9AM  Day of study: day 1  Medications include: Per EMR  MENTAL STATUS (per technician's notes): Intubated. Sedated.  HISTORY: This 24 hours of intensive EEG monitoring with simultaneous video monitoring was performed for this patient with altered mental status. This EEG was requested to rule out subclinical electrographic seizures.  TECHNICAL DESCRIPTION:  The study consists of a continuous 16-channel multi-montage digital video EEG recording with twenty-one electrodes placed according to the International 10-20 System. Additional leads included eye leads, true temporal leads (T1, T2), and an EKG lead. Activation procedures were not done due to mental status.  REPORT: The background activity in this tracing consisted of low voltage delta and theta background. The activity was symmetric. There was some variability in the recording, with overriding muscle artifact seen, specially in the frontal leads but no clear reactivity testing was done.   No focal slowing or epileptiform activity was identified.    IMPRESSION: This is an abnormal EEG due to low voltage diffuse slowing.  CLINICAL CORRELATION: This EEG is consistent with non-specific severe diffuse cerebral dysfunction. No electrographic seizures were seen.

## 2015-07-07 ENCOUNTER — Inpatient Hospital Stay (HOSPITAL_COMMUNITY): Payer: Medicaid Other

## 2015-07-07 LAB — GLUCOSE, CAPILLARY
GLUCOSE-CAPILLARY: 122 mg/dL — AB (ref 65–99)
GLUCOSE-CAPILLARY: 152 mg/dL — AB (ref 65–99)
Glucose-Capillary: 105 mg/dL — ABNORMAL HIGH (ref 65–99)
Glucose-Capillary: 118 mg/dL — ABNORMAL HIGH (ref 65–99)

## 2015-07-07 LAB — PHOSPHORUS: PHOSPHORUS: 3.3 mg/dL (ref 2.5–4.6)

## 2015-07-07 LAB — MAGNESIUM: MAGNESIUM: 2.2 mg/dL (ref 1.7–2.4)

## 2015-07-07 LAB — CBC
HEMATOCRIT: 32.2 % — AB (ref 36.0–46.0)
HEMOGLOBIN: 9.8 g/dL — AB (ref 12.0–15.0)
MCH: 28.4 pg (ref 26.0–34.0)
MCHC: 30.4 g/dL (ref 30.0–36.0)
MCV: 93.3 fL (ref 78.0–100.0)
Platelets: 192 10*3/uL (ref 150–400)
RBC: 3.45 MIL/uL — AB (ref 3.87–5.11)
RDW: 15.4 % (ref 11.5–15.5)
WBC: 7.8 10*3/uL (ref 4.0–10.5)

## 2015-07-07 LAB — BASIC METABOLIC PANEL
ANION GAP: 9 (ref 5–15)
BUN: 22 mg/dL — ABNORMAL HIGH (ref 6–20)
CHLORIDE: 125 mmol/L — AB (ref 101–111)
CO2: 28 mmol/L (ref 22–32)
CREATININE: 0.58 mg/dL (ref 0.44–1.00)
Calcium: 8.1 mg/dL — ABNORMAL LOW (ref 8.9–10.3)
GFR calc Af Amer: >60 mL/min (ref >60)
GFR calc non Af Amer: >60 mL/min (ref >60)
Glucose, Bld: 147 mg/dL — ABNORMAL HIGH (ref 65–99)
POTASSIUM: 3.4 mmol/L — AB (ref 3.5–5.1)
SODIUM: 162 mmol/L — AB (ref 135–145)

## 2015-07-07 LAB — SODIUM: SODIUM: 160 mmol/L — AB (ref 135–145)

## 2015-07-07 LAB — CULTURE, BLOOD (ROUTINE X 2)

## 2015-07-07 LAB — TRIGLYCERIDES: TRIGLYCERIDES: 170 mg/dL — AB (ref <150)

## 2015-07-07 MED ORDER — SODIUM CHLORIDE 0.9 % IV SOLN
1.0000 mg/h | INTRAVENOUS | Status: DC
  Filled 2015-07-07: qty 10

## 2015-07-07 MED ORDER — SODIUM CHLORIDE 0.9 % IV SOLN
100.0000 ug/h | INTRAVENOUS | Status: DC
  Administered 2015-07-07: 200 ug/h via INTRAVENOUS
  Filled 2015-07-07 (×2): qty 50

## 2015-07-07 MED ORDER — FENTANYL BOLUS VIA INFUSION
50.0000 ug | INTRAVENOUS | Status: DC | PRN
  Administered 2015-07-07: 100 ug via INTRAVENOUS
  Filled 2015-07-07 (×2): qty 200

## 2015-07-07 MED ORDER — SODIUM CHLORIDE 0.9 % IV SOLN
10.0000 mg/h | INTRAVENOUS | Status: DC
  Administered 2015-07-07 (×2): 10 mg/h via INTRAVENOUS
  Filled 2015-07-07 (×2): qty 10

## 2015-07-07 MED ORDER — MIDAZOLAM BOLUS VIA INFUSION (WITHDRAWAL LIFE SUSTAINING TX)
5.0000 mg | INTRAVENOUS | Status: DC | PRN
Start: 2015-07-07 — End: 2015-07-08
  Administered 2015-07-07: 5 mg via INTRAVENOUS
  Filled 2015-07-07 (×2): qty 20

## 2015-07-09 LAB — CULTURE, BLOOD (ROUTINE X 2): CULTURE: NO GROWTH

## 2015-07-09 LAB — CULTURE, RESPIRATORY W GRAM STAIN

## 2015-07-09 LAB — CULTURE, RESPIRATORY: SPECIAL REQUESTS: NORMAL

## 2015-07-12 ENCOUNTER — Telehealth: Payer: Self-pay

## 2015-07-12 NOTE — Telephone Encounter (Signed)
On 07/12/2015 I [facility] ([facility] The death certificate is for burial. The patient is a patient of Doctor Maneem. The death certificate will be taken to [facility] tomorrow am for signature. Doctor Maneem will sign the death certificate Monday afternoon when he is at [facility]. On 07/18/2015 I received the death certificate back from Doctor Maneem. I got the death certificate ready and called the funeral home to let them know I [facility].

## 2015-07-14 NOTE — Discharge Summary (Signed)
Name:Gabriella Baker AVW:[id number] DOB:1960-11-16   ADMISSION DATE: 10/02/15 DATE OF DEATH: 06/28/2015  55 year old female with PMH COPD and CAD. Has been feeling "unwell" for about a week. Thought she had pneumonia with SOB. 5/15 she suffered a witnessed cardiac arrest. Bystander called EMS, but did not initiate CPR. EMS arrived about 10 to 15 mins later. They noted her to be asystolic on the cardiac monitor. They started ACLS while en route to the hospital which required about 25 mins ACLS until ROSC. King airway was placed and she was difficult to bag. Upon arrival to ED king airway was replaced, which was a difficult transition. She was then found to have subcutaneous air and tension pneumothorax. Reportedly needle decompression was attempted, but location was incorrect and subclavian artery was punctured. Chest tube was then placed to L chest. Patient was transferred to  for ICU admission. Hypothermia protocol was not initiated due to the prolonged down time.   She continued to have poor mental status. EEG showed status epilepticus and neurology was consulted. She grew H influenzae in blood cultures and was treated with antibiotics. MRI head showed cerebral edema, elevated ICP and early herniation and severe anoxic injury. Multiple family conversations we held and family decided to withdraw care. She was extubated to comfort on 5/19 and passed away shortly thereafter.  Chilton GreathousePraveen Ciarra Braddy MD [facility] Pager [phone] If no answer or after 3pm call: [phone] 07/14/2015, 7:20 PM

## 2015-07-20 NOTE — Procedures (Signed)
Extubation Procedure Note  Patient Details:   Name: Gabriella Baker DOB: [birth year] MRN: [id number]   Airway Documentation:     Evaluation Pt extubated at this time per MD order.    Cherylin MylarDoyle, Josefita Weissmann 06-05-2015, 6:00 PM

## 2015-07-20 NOTE — Progress Notes (Signed)
Pt passed at 2331pm, family at bedside. Devota PaceKaylee Donnica Jarnagin RN and Elberta Fortisaroline Schenck RN ausculted for 3 mins, no heart or lung sounds heard. 50 ml of versed and 100 ml of Fentlyn were wasted in sink by Saint MartinKaylee and Sears Holdings CorporationCaroline. CDS notified at 2345.

## 2015-07-20 NOTE — Progress Notes (Signed)
[facility] Physician-Brief Progress Note Patient Name: Jacqualyn PoseyRuth Kittell DOB: 07-05-1960 MRN: [id number]   Date of Service  January 16, 2016  HPI/Events of Note  RN calld - says lot of active orders but terminal wean impending once all family members arrived  eICU Interventions  - dc'ed active superfluous orders - dc diprivan gtt - start versed gtt for comfort - continue fentanyl gtt for comfort - dc diprivan gtt  Terminal Wean orders - sign and held done      Intervention Category Major Interventions: End of life / care limitation discussion  Aashika Carta January 16, 2016, 4:29 PM

## 2015-07-20 NOTE — Procedures (Signed)
Electroencephalogram report- LTM  Ordering Physician : Dr. Lavon PaganiniNandigam EEG number: [phone]    Beginning date or time: 07/06/2015 9AM Ending date or time: 07/15/2015 9AM  Day of study: day 2  Medications include: Per EMR  MENTAL STATUS (per technician's notes): Intubated. Sedated.  HISTORY: This 24 hours of intensive EEG monitoring with simultaneous video monitoring was performed for this patient with altered mental status. This EEG was requested to rule out subclinical electrographic seizures.  TECHNICAL DESCRIPTION:  The study consists of a continuous 16-channel multi-montage digital video EEG recording with twenty-one electrodes placed according to the International 10-20 System. Additional leads included eye leads, true temporal leads (T1, T2), and an EKG lead. Activation procedures were not done due to mental status.  REPORT: The background activity in this tracing consisted of low voltage delta and theta background. The activity was symmetric. There was some overriding muscle artifact seen, specially in the frontal leads but no clear reactivity was seen. Of note, formal reactivity testing was not done.   No focal slowing or epileptiform activity was identified.    IMPRESSION: This is an abnormal EEG due to low voltage diffuse slowing.  CLINICAL CORRELATION: This EEG is consistent with non-specific severe diffuse cerebral dysfunction. No electrographic seizures were seen. There is no significant change from previous day's recording.

## 2015-07-20 NOTE — Progress Notes (Signed)
PULMONARY / CRITICAL CARE MEDICINE   Name: Gabriella Baker MRN: [id number] DOB: 25-Mar-1960    ADMISSION DATE:  07/03/2015 CONSULTATION DATE:  06/23/2015  REFERRING MD:  [facility]  CHIEF COMPLAINT:  Cardiac arrest  HISTORY OF PRESENT ILLNESS:  Patient is encephalopathic and/or intubated. Therefore history has been obtained from chart review.  55 year old female with PMH COPD and CAD. Has been feeling "unwell" for about a week. Thought she had pneumonia with SOB. 5/15 she suffered a witnessed cardiac arrest. Bystander called EMS, but did not initiate CPR. EMS arrived about 10 to 15 mins later. They noted her to be asystolic on the cardiac monitor. They started ACLS while en route to the hospital which required about 25 mins ACLS until ROSC. King airway was placed and she was difficult to bag. Upon arrival to ED king airway was replaced, which was a difficult transition. She was then found to have subcutaneous air and tension pneumothorax. Reportedly needle decompression was attempted, but location was incorrect and subclavian artery was punctured. Chest tube was then placed to L chest. Patient was transferred to [place] for ICU admission. Myoclonus noted en route.    PAST MEDICAL HISTORY :  She  has a past medical history of COPD (chronic obstructive pulmonary disease) ([place]); Pneumonia; Myocardial infarction [facility]); GERD (gastroesophageal reflux disease); Sleep apnea; Anginal pain ([place]); and CHF (congestive heart failure) ([place]).  PAST SURGICAL HISTORY: She  has past surgical history that includes Coronary angioplasty; Tonsillectomy; and Coronary artery bypass graft.  Allergies  Allergen Reactions  . Levaquin [Levofloxacin]     No current facility-administered medications on file prior to encounter.   No current outpatient prescriptions on file prior to encounter.    FAMILY HISTORY:  Her has no family status information on file.   SOCIAL HISTORY: She  reports that she has been smoking  Cigarettes.  She has been smoking about 1.00 pack per day. She does not have any smokeless tobacco history on file. She reports that she drinks about 1.2 oz of alcohol per week. She reports that she does not use illicit drugs.  REVIEW OF SYSTEMS:   Unable  SUBJECTIVE:   VITAL SIGNS: BP 104/65 mmHg  Pulse 70  Temp(Src) 96.3 F (35.7 C) (Axillary)  Resp 28  Ht 5' 7"  (1.702 m)  Wt 217 lb 2.5 oz (98.5 kg)  BMI 34.00 kg/m2  SpO2 94%  LMP  (LMP Unknown)  HEMODYNAMICS:    VENTILATOR SETTINGS: Vent Mode:  [-] PRVC FiO2 (%):  [40 %] 40 % Set Rate:  [28 bmp] 28 bmp Vt Set:  [450 mL] 450 mL PEEP:  [5 cmH20] 5 cmH20 Plateau Pressure:  [19 cmH20-23 cmH20] 23 cmH20  INTAKE / OUTPUT: I/O last 3 completed shifts: In: 1025 [I.V.:3840.5; NG/GT:349.5; IV Piggyback:1320] Out: 8527 [Urine:3510; Emesis/NG output:400; Chest Tube:10]  PHYSICAL EXAMINATION: General:  No distress.  Neuro:  Comatose, unresponsive.  HEENT:  Moist mucus membranes, No thyromegaly, JVD Cardiovascular:  RRR, no MRG Lungs:  SuQ emphysema improving. Abdomen:  Distended, + BS Musculoskeletal:  No acute deformity Skin:  Intact  LABS:  BMET  Recent Labs Lab 07/06/15 0215  07/06/15 1700 07/06/15 2130 2015-08-01 0500 08/01/15 1002  NA 146*  < > 151* 155* 162* 160*  K 3.3*  --  2.6*  --  3.4*  --   CL 111  --  113*  --  125*  --   CO2 26  --  27  --  28  --  BUN 9  --  11  --  22*  --   CREATININE 0.65  --  0.66  --  0.58  --   GLUCOSE 133*  --  178*  --  147*  --   < > = values in this interval not displayed.  Electrolytes  Recent Labs Lab 07/13/2015 0410  07/06/15 0215 07/06/15 1700 07/21/2015 0500  CALCIUM 7.8*  < > 8.2* 7.9* 8.1*  MG 1.7  --  1.9  --  2.2  PHOS 4.7*  --  1.8*  --  3.3  < > = values in this interval not displayed.  CBC  Recent Labs Lab 06/26/2015 0410 07/06/15 0215 07-21-2015 0500  WBC 17.2* 11.8* 7.8  HGB 12.5 10.8* 9.8*  HCT 39.4 33.9* 32.2*  PLT 207 218 192     Coag's  Recent Labs Lab 06/29/2015 0410  INR 1.15    Sepsis Markers  Recent Labs Lab 06/28/2015 0420 06/25/2015 1508 07/05/15 0250 07/06/15 0215  LATICACIDVEN 1.5  --   --   --   PROCALCITON  --  2.72 2.37 1.54    ABG  Recent Labs Lab 06/21/2015 1240 07/05/15 0902 07/06/15 0345  PHART 7.355 7.347* 7.398  PCO2ART 46.6* 49.0* 43.7  PO2ART 60.0* 97.6 72.2*    Liver Enzymes  Recent Labs Lab 07/11/2015 0410  AST 76*  ALT 53  ALKPHOS 79  BILITOT 0.5  ALBUMIN 2.6*    Cardiac Enzymes  Recent Labs Lab 06/21/2015 0410 06/24/2015 1007 07/03/2015 1508  TROPONINI 0.07* 0.04* 0.04*    Glucose  Recent Labs Lab 07/06/15 1540 07/06/15 1922 July 21, 2015 0015 07-21-2015 0441 21-Jul-2015 0754 07-21-2015 1142  GLUCAP 186* 105* 122* 152* 105* 118*    Imaging Dg Chest Port 1 View  07/21/2015  CLINICAL DATA:  Acute respiratory failure EXAM: PORTABLE CHEST 1 VIEW COMPARISON:  Portable chest x-ray of Jul 06, 2015 FINDINGS: The lungs are well-expanded. The interstitial markings are less prominent overall. The infrahilar region on the right has improved. The heart is top-normal in size. The pulmonary vascularity is not engorged. The left-sided chest tube tip is in stable position. No pneumothorax or pleural effusion is evident. The endotracheal tube tip lies 3.4 cm above the carina. The esophagogastric tube tip projects below the inferior margin of the image. The left subclavian venous catheter tip projects over the midportion of the SVC. The sternal wires are intact. IMPRESSION: Further improvement in the appearance of the chest with decreased interstitial edema. The cardiac silhouette is now top-normal in size. Right infrahilar subsegmental atelectasis has improved. The support tubes are in stable position. No pneumothorax or significant pleural effusion. Electronically Signed   By: David  Martinique M.D.   On: 07/21/15 07:26     STUDIES:  CTA chest 5/15 > no PE, large L PTX with associated  pneumomediastinum and pneumoperitoneum. Subq emphysema  CT head 5/15 > cerebral edema MRI head 07/05/15 > severe anoxic injury, impending herniation.   CULTURES: BC 5/16 >  Urine 5/16 >   ANTIBIOTICS: None  SIGNIFICANT EVENTS: 5/15 Cardiac arrest  LINES/TUBES: ETT 5/15 >  DISCUSSION: 66 F s/p asystolic cardiac arrest. Downtime estimated just over 30 mins. Severe anoxic injury  ASSESSMENT / PLAN:  PULMONARY A: Inability to protect airway secondary to cardiac arrest Acute hypercarbic respiratory failure COPD with suspected exacerbation L PTX Pneumomediatstinum  P:   Full vent support No weaning due to poor mental status.  Abx as above Nebs IV steroids CT to suction 20 cmH20  CARDIOVASCULAR A:  Cardiac arrest asystole H/o CAD Pneumomediastinum  P:  Telemetry monitoring MAP goal > 77mHg Lasix 20 mg once to keep I/O even  RENAL A:   Hyponatremia likelySIADH. Now with increased Na on hypertonic saline. P:   Follow urine output and Cr  GASTROINTESTINAL A:   Abdominal distension, likely due to bagging king airway. Pneumoperitoneum likely secondary to large PTX  P:   On tube feeds NPO tonight Protonix  HEMATOLOGIC A:   ? PE (d-dimer at [place] > 8000), d/w radiolgy, no PE demonstrated. P:  No PE on CT scan  INFECTIOUS A:   CAP H. Flu bacteremia P:   Monitor WBC and fever curve On abx for CAP , H Flu Narrow to ceftriaxone alone.  ENDOCRINE A:   Hyperglycemia with questionable history of DM  P:   CBG monitoring and SSI  NEUROLOGIC A:   Acute anoxic encephalopathy MRI noted with cerebral edema from anoxic injury, elevated ICP and early cerebellar herniation.  Status epilepticus  P:  Continuous EEG 3% saline held due to hypernatremia   FAMILY  - Updates: son 5/16 early AM via phone. She is legally married to wife dawn, but they are separated. Will need to further explore who is the decision maker. Full code.  -  Inter-disciplinary family meet or Palliative Care meeting due by:  5/23.  Interdisciplinary Goals of Care Family Meeting Date carried out: 506/12/17 Location of the meeting: Conference room  Member's involved: Physician, Bedside Registered Nurse and Family Member or next of kin.  Durable Power of ATour manager Sons    Discussion: We discussed goals of care for RJoanann Mies.  I brought them uptodate with events and studies. Reiterated that she has severe anoxic injury with brain edema and herniation. Her mental status has not improved since out last meeting and off sedation. Her EEG is c/w with severe diffuse dysfunction. The change of any meaningful recovery are slim to none. The family later met with the neurologist Dr. NSilverio Decampwho agreed with this assessment and reviewed the MRI and EEG with the family.   They have decided to withdraw care. This will likely happen later today after the rest of the family has a chance to visit.  Disposition: DNR, no escalation. Withdrawal of care later today 5/19  Time spent for the meeting: 15  Critical care time- 35 mins.  PMarshell GarfinkelMD [facility] Pager [phone] no answer or after 3pm call: [phone] 52017/06/12 2:04 PM

## 2015-07-20 NOTE — Consult Note (Signed)
[facility] to check on pt family and inquire about grief and emotional support as well as prayer as pt actively dying.  Gabriella LevineMichael I Donyea Beverlin

## 2015-07-20 NOTE — Progress Notes (Signed)
CRITICAL VALUE ALERT  Critical value received:  Na 162  Date of notification:  07/10/2015  Time of notification:  0700  Critical value read back:Yes.    Nurse who received alert:  Marco Collieharis Camauri Fleece RN  MD notified (1st page):  Vassie LollAlva  Time of first page:  0705  Responding MD:  Vassie LollAlva  Time MD responded:  0715  No new orders. MD notified that 3% saline turned off as order says to turn off saline for Na > 155.

## 2015-07-20 DEATH — deceased

## 2018-01-27 IMAGING — CR DG CHEST 1V PORT
1 series · 1 of 1 positions shown · non-contrast
Comparison: Portable chest x-ray July 05, 2015

CLINICAL DATA: Acute respiratory failure

EXAM:
PORTABLE CHEST 1 VIEW

[AP]
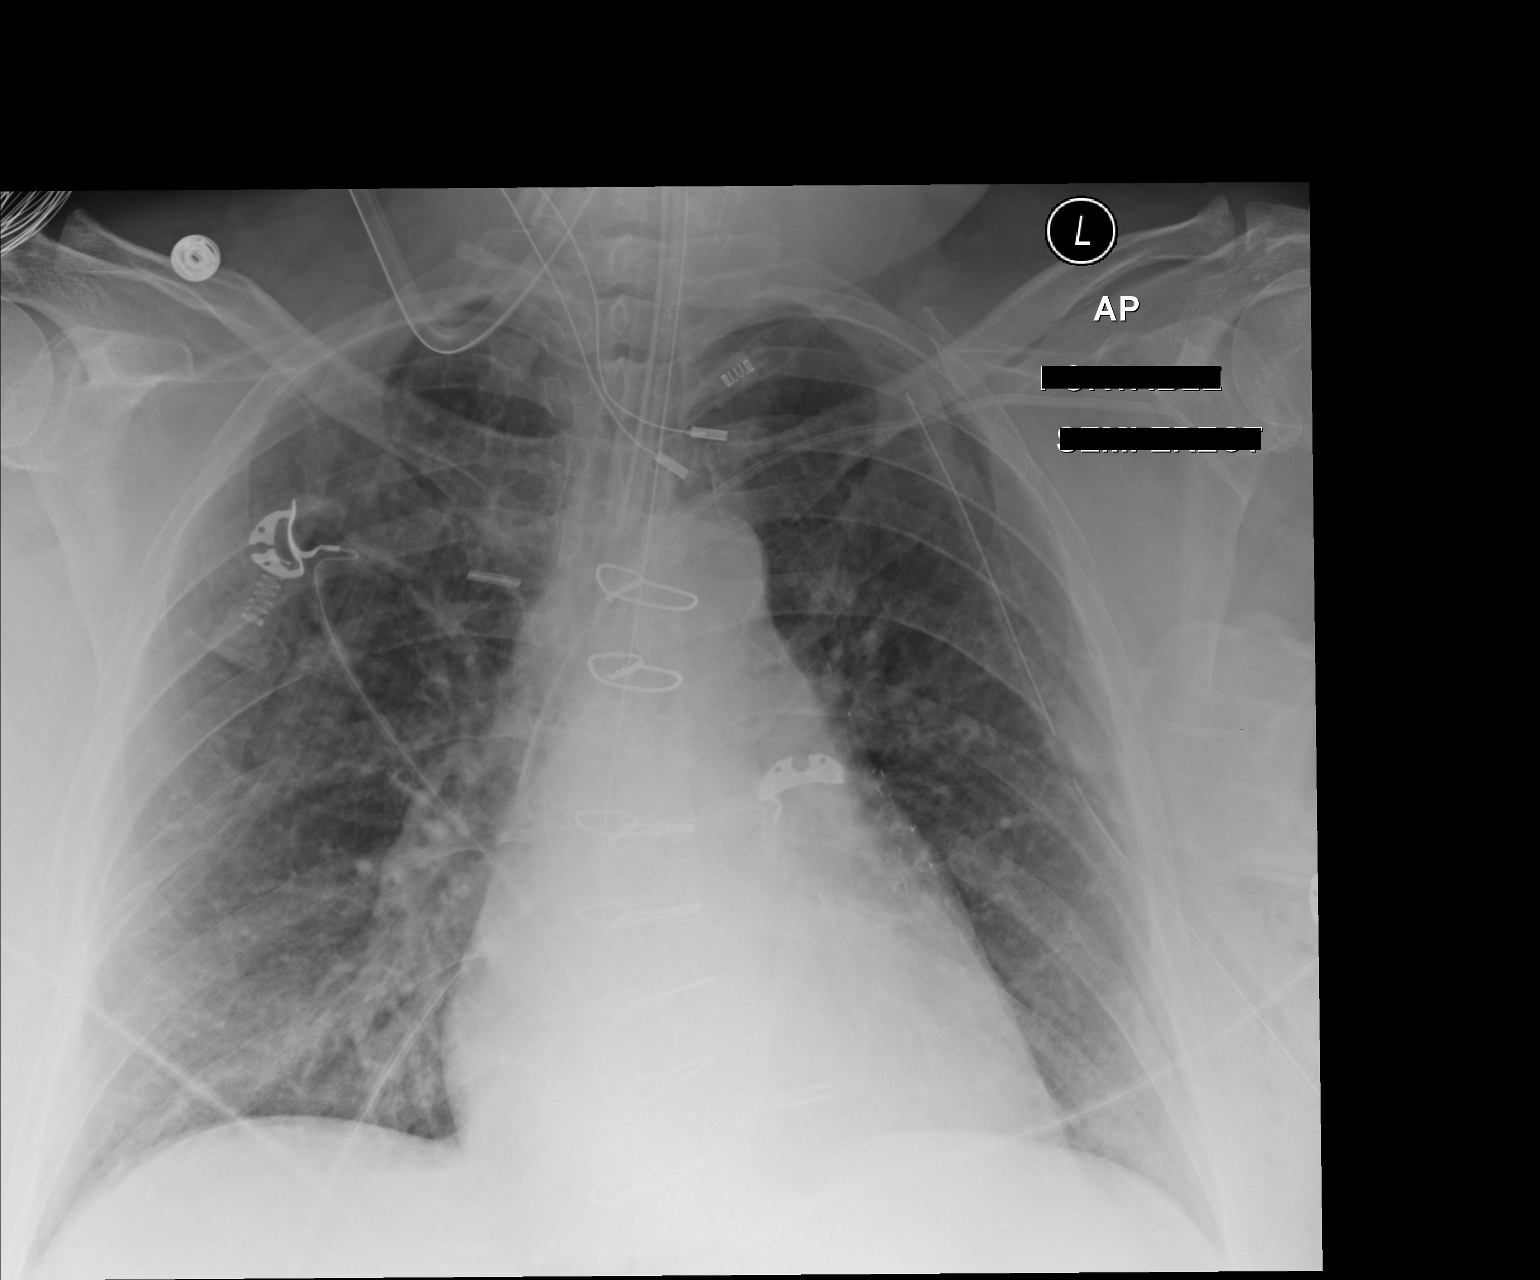

[1 of 1 positions shown; findings below may reference images not displayed]

FINDINGS: The lungs are well-expanded. The interstitial markings remain
increased but are slightly less conspicuous especially on the left
today. The cardiac silhouette remains enlarged. The central
pulmonary vascularity is prominent. The left-sided chest tube is
stable with the tip projecting over the posterior lateral aspect of
the third rib. No pneumothorax is evident. The left subclavian
venous catheter tip projects over the midportion of the SVC. The
endotracheal tube tip lies 4 cm above the carina. The
esophagogastric tube tip projects below the inferior margin of the
image.
IMPRESSION: Slight interval improvement in the pulmonary interstitium especially
on the left. Persistent interstitial edema and mild cardiomegaly. No
pneumothorax. The support tubes are in reasonable position.
